# Patient Record
Sex: Female | Born: 1990 | Race: Black or African American | Hispanic: No | Marital: Married | Smoking: Former smoker
Health system: Southern US, Community
[De-identification: ages and names within clinical notes are randomized; demographics above are authoritative.]

## PROBLEM LIST (undated history)

## (undated) ENCOUNTER — Inpatient Hospital Stay (HOSPITAL_COMMUNITY): Payer: Self-pay

## (undated) HISTORY — PX: OTHER SURGICAL HISTORY: SHX169

---
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
## 2010-04-24 ENCOUNTER — Emergency Department (HOSPITAL_COMMUNITY)
Admission: EM | Admit: 2010-04-24 | Discharge: 2010-04-24 | Discharge status: Against Medical Advice | Payer: Self-pay | Attending: Emergency Medicine | Admitting: Emergency Medicine

## 2010-04-24 DIAGNOSIS — H9209 Otalgia, unspecified ear: Secondary | ICD-10-CM | POA: Insufficient documentation

## 2011-03-12 ENCOUNTER — Encounter (HOSPITAL_COMMUNITY): Payer: Self-pay | Admitting: Emergency Medicine

## 2011-03-12 ENCOUNTER — Other Ambulatory Visit: Payer: Self-pay

## 2011-03-12 ENCOUNTER — Emergency Department (HOSPITAL_COMMUNITY)
Admission: EM | Admit: 2011-03-12 | Discharge: 2011-03-12 | Disposition: A | Discharge status: Home / Self Care | Payer: BC Managed Care – PPO | Attending: Emergency Medicine | Admitting: Emergency Medicine

## 2011-03-12 ENCOUNTER — Emergency Department (HOSPITAL_COMMUNITY): Payer: BC Managed Care – PPO

## 2011-03-12 DIAGNOSIS — J45909 Unspecified asthma, uncomplicated: Secondary | ICD-10-CM | POA: Insufficient documentation

## 2011-03-12 DIAGNOSIS — R072 Precordial pain: Secondary | ICD-10-CM | POA: Insufficient documentation

## 2011-03-12 DIAGNOSIS — K219 Gastro-esophageal reflux disease without esophagitis: Secondary | ICD-10-CM | POA: Insufficient documentation

## 2011-03-12 MED ORDER — GI COCKTAIL ~~LOC~~
30.0000 mL | Freq: Once | ORAL | Status: AC
  Administered 2011-03-12: 30 mL via ORAL
  Filled 2011-03-12: qty 30

## 2011-03-12 MED ORDER — FAMOTIDINE 20 MG PO TABS: 20.0000 mg | ORAL_TABLET | Freq: Two times a day (BID) | ORAL | Status: DC

## 2011-03-12 NOTE — ED Provider Notes (Signed)
History     CSN: [id number]  Arrival date & time 03/12/11  7829   First MD Initiated Contact with Patient 03/12/11 [phone]      Chief Complaint  Patient presents with  . Chest Pain     HPI  History provided by the patient. She is a 21 year old female with past history of asthma who presents with complaints of midsternal chest pain that began yesterday afternoon. Pain is a burning sensation that does not radiate. Pain is made worse by lying flat. Pain is not pleuritic or made worse with exertion. Patient took aspirin for her symptoms without significant relief. Patient denies any alleviating factor. Patient denies any fever, chills, sweats, nausea, vomiting, heart palpitations, shortness of breath. Patient denies any recent cough, nasal congestion, rhinorrhea. Patient denies any tobacco use or drug use, no cocaine. Patient has no recent travel or surgery. Patient has no other significant past medical history.    Past Medical History  Diagnosis Date  . Asthma     Past Surgical History  Procedure Date  . Breast tumor     No family history on file.  History  Substance Use Topics  . Smoking status: Never Smoker   . Smokeless tobacco: Not on file  . Alcohol Use: No    OB History    Grav Para Term Preterm Abortions TAB SAB Ect Mult Living                  Review of Systems  Constitutional: Negative for fever and chills.  Respiratory: Negative for cough, shortness of breath and wheezing.   Cardiovascular: Positive for chest pain. Negative for palpitations and leg swelling.  Gastrointestinal: Negative for nausea, vomiting, abdominal pain, diarrhea, constipation and blood in stool.  All other systems reviewed and are negative.    Allergies  Review of patient's allergies indicates no known allergies.  Home Medications   Current Outpatient Rx  Name Route Sig Dispense Refill  . BORIC ACID EX Apply externally Apply 1 capsule topically 2 (two) times a week.    .  ERGOCALCIFEROL 50000 UNITS PO CAPS Oral Take 50,000 Units by mouth 2 (two) times a week.    Marland Kitchen PRESCRIPTION MEDICATION Oral Take 1 tablet by mouth daily.      LMP 03/08/2011  Physical Exam  Nursing note and vitals reviewed. Constitutional: She is oriented to person, place, and time. She appears well-developed and well-nourished. No distress.  HENT:  Head: Normocephalic.  Cardiovascular: Normal rate and regular rhythm.   Pulmonary/Chest: Effort normal and breath sounds normal.  Abdominal: Soft.  Neurological: She is alert and oriented to person, place, and time.  Skin: Skin is warm and dry. No rash noted.  Psychiatric: She has a normal mood and affect. Her behavior is normal.    ED Course  Procedures    Dg Chest 2 View  03/12/2011  *RADIOLOGY REPORT*  Clinical Data: Chest pain.  CHEST - 2 VIEW  Comparison: None.  Findings: The lungs are well-aerated and clear.  There is no evidence of focal opacification, pleural effusion or pneumothorax.  The heart is normal in size; the mediastinal contour is within normal limits.  No acute osseous abnormalities are seen.  IMPRESSION: No acute cardiopulmonary process seen.  Original Report Authenticated By: Tonia Ghent, M.D.     1. GERD (gastroesophageal reflux disease)       MDM  3:40 AM patient seen and evaluated. Patient in no acute distress. Pain is not pleuritic. Patient denies shortness of  breath. Patient has normal heart rate and O2 saturation. Patient does use oral birth control but has no other significant risk factors for DVT/PE.   Patient reports feeling better after GI cocktail. EKG and chest x-ray are unremarkable. Patient with no other concerning symptoms. Symptoms seem most consistent with possible reflux. At this time will recommend antacid treatments and follow up with PCP.    Date: 03/12/2011  Rate: 84  Rhythm: normal sinus rhythm  QRS Axis: normal  Intervals: normal  ST/T Wave abnormalities: normal  Conduction  Disutrbances:none  Narrative Interpretation:   Old EKG Reviewed: none available    Angus Seller, [place] 03/12/11 [phone]  Medical screening examination/treatment/procedure(s) were performed by non-physician practitioner and as supervising physician I was immediately available for consultation/collaboration.  Sunnie Nielsen, MD 03/12/11 [phone]

## 2011-03-12 NOTE — ED Notes (Signed)
PT. REPORTS MIDSTERNAL CHEST PAIN ONSET YESTERDAY , DENIES SOB ,NAUSEA OR DIAPHORESIS . NO COUGH OR CONGESTION .

## 2011-03-12 NOTE — ED Notes (Signed)
Patient transported to X-ray 

## 2011-03-12 NOTE — ED Notes (Signed)
Pt. Requested CKmb be ran, discussed with MD, pt. was told to have test run at PCP office, not indicated at this time.

## 2011-06-30 ENCOUNTER — Ambulatory Visit (INDEPENDENT_AMBULATORY_CARE_PROVIDER_SITE_OTHER): Payer: BC Managed Care – PPO | Admitting: Obstetrics and Gynecology

## 2011-06-30 ENCOUNTER — Encounter: Payer: Self-pay | Admitting: Obstetrics and Gynecology

## 2011-06-30 ENCOUNTER — Telehealth: Payer: Self-pay | Admitting: Obstetrics and Gynecology

## 2011-06-30 VITALS — BP 116/72 | HR 74 | Ht 67.25 in | Wt 291.0 lb

## 2011-06-30 DIAGNOSIS — N898 Other specified noninflammatory disorders of vagina: Secondary | ICD-10-CM

## 2011-06-30 DIAGNOSIS — Z113 Encounter for screening for infections with a predominantly sexual mode of transmission: Secondary | ICD-10-CM

## 2011-06-30 LAB — POCT WET PREP (WET MOUNT)

## 2011-06-30 MED ORDER — METRONIDAZOLE 500 MG PO TABS: 500.0000 mg | ORAL_TABLET | Freq: Two times a day (BID) | ORAL | Status: AC

## 2011-06-30 MED ORDER — AMBULATORY NON FORMULARY MEDICATION: 50000.0000 [IU] | Status: DC

## 2011-06-30 MED ORDER — FLUCONAZOLE 150 MG PO TABS: 150.0000 mg | ORAL_TABLET | Freq: Once | ORAL | Status: AC

## 2011-06-30 MED ORDER — NORETHIN ACE-ETH ESTRAD-FE 1-20 MG-MCG PO TABS: 1.0000 | ORAL_TABLET | Freq: Every day | ORAL | Status: DC

## 2011-06-30 NOTE — Progress Notes (Signed)
Color: white Odor: yes Itching:yes Thin:yes Thick:yes Fever:no Dyspareunia:no Hx PID:no HX STD:no Pelvic Pain:no Desires Gc/CT:yes Desires HIV,RPR,HbsAG:yes  Pt c/o ph level seem off

## 2011-06-30 NOTE — Progress Notes (Signed)
21 YO c/o recurrent vaginitis symptoms and desires refill on contraception.  Patient used Boric Acid Capsules x 2 months but states that they did not work.  Also took only 4 weeks of Vitamin D 50,000 units because that was all she was given at pharmacy  (Vitamin D was 10 on Jan. 11, 2013).   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

## 2011-06-30 NOTE — Patient Instructions (Signed)
Avoid: - excess soap on genital area (consider using plain oatmeal soap) - use of powder or sprays in genital area - douching - wearing underwear to bed (except with menses) - using more than is directed detergent when washing clothes - tight fitting garments around genital area - excess sugar intake   

## 2011-07-01 LAB — GC/CHLAMYDIA PROBE AMP, GENITAL: GC Probe Amp, Genital: NEGATIVE

## 2011-07-01 LAB — HSV 2 ANTIBODY, IGG: HSV 2 Glycoprotein G Ab, IgG: 0.17 IV

## 2011-07-01 LAB — RPR

## 2011-07-01 LAB — HEPATITIS C ANTIBODY: HCV Ab: NEGATIVE

## 2011-07-14 NOTE — Telephone Encounter (Signed)
Lm on vm tcb rgd msg 

## 2011-07-14 NOTE — Telephone Encounter (Signed)
Triage/epic 

## 2011-07-16 NOTE — Telephone Encounter (Signed)
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

## 2012-02-19 ENCOUNTER — Emergency Department (HOSPITAL_COMMUNITY)
Admission: EM | Admit: 2012-02-19 | Discharge: 2012-02-19 | Discharge status: Against Medical Advice | Payer: BC Managed Care – PPO | Attending: Emergency Medicine | Admitting: Emergency Medicine

## 2012-02-19 DIAGNOSIS — J45909 Unspecified asthma, uncomplicated: Secondary | ICD-10-CM | POA: Insufficient documentation

## 2012-02-19 NOTE — ED Notes (Signed)
Heart rate 122, oxygen 100 nurse Maggie RN aware

## 2012-02-19 NOTE — ED Notes (Signed)
[employer] reports patient left the building

## 2012-06-09 ENCOUNTER — Emergency Department (HOSPITAL_COMMUNITY)
Admission: EM | Admit: 2012-06-09 | Discharge: 2012-06-09 | Disposition: A | Discharge status: Home / Self Care | Payer: BC Managed Care – PPO | Attending: Emergency Medicine | Admitting: Emergency Medicine

## 2012-06-09 ENCOUNTER — Encounter (HOSPITAL_COMMUNITY): Payer: Self-pay | Admitting: *Deleted

## 2012-06-09 ENCOUNTER — Emergency Department (HOSPITAL_COMMUNITY): Payer: BC Managed Care – PPO

## 2012-06-09 DIAGNOSIS — J45909 Unspecified asthma, uncomplicated: Secondary | ICD-10-CM | POA: Insufficient documentation

## 2012-06-09 DIAGNOSIS — I498 Other specified cardiac arrhythmias: Secondary | ICD-10-CM | POA: Insufficient documentation

## 2012-06-09 DIAGNOSIS — R0789 Other chest pain: Secondary | ICD-10-CM

## 2012-06-09 DIAGNOSIS — R000 Tachycardia, unspecified: Secondary | ICD-10-CM

## 2012-06-09 DIAGNOSIS — F411 Generalized anxiety disorder: Secondary | ICD-10-CM | POA: Insufficient documentation

## 2012-06-09 DIAGNOSIS — K219 Gastro-esophageal reflux disease without esophagitis: Secondary | ICD-10-CM | POA: Insufficient documentation

## 2012-06-09 DIAGNOSIS — IMO0002 Reserved for concepts with insufficient information to code with codable children: Secondary | ICD-10-CM | POA: Insufficient documentation

## 2012-06-09 DIAGNOSIS — R509 Fever, unspecified: Secondary | ICD-10-CM

## 2012-06-09 DIAGNOSIS — Z79899 Other long term (current) drug therapy: Secondary | ICD-10-CM | POA: Insufficient documentation

## 2012-06-09 DIAGNOSIS — K089 Disorder of teeth and supporting structures, unspecified: Secondary | ICD-10-CM | POA: Insufficient documentation

## 2012-06-09 LAB — CBC
HCT: 40.7 % (ref 36.0–46.0)
Hemoglobin: 14 g/dL (ref 12.0–15.0)
MCH: 27.2 pg (ref 26.0–34.0)
MCHC: 34.4 g/dL (ref 30.0–36.0)
MCV: 79 fL (ref 78.0–100.0)
Platelets: 145 K/uL — ABNORMAL LOW (ref 150–400)
RBC: 5.15 MIL/uL — ABNORMAL HIGH (ref 3.87–5.11)
RDW: 15.3 % (ref 11.5–15.5)
WBC: 5.3 10*3/uL (ref 4.0–10.5)

## 2012-06-09 LAB — URINE MICROSCOPIC-ADD ON

## 2012-06-09 LAB — POCT I-STAT, CHEM 8
BUN: 9 mg/dL (ref 6–23)
Calcium, Ion: 1.22 mmol/L (ref 1.12–1.23)
Chloride: 107 meq/L (ref 96–112)
Creatinine, Ser: 0.8 mg/dL (ref 0.50–1.10)
Glucose, Bld: 101 mg/dL — ABNORMAL HIGH (ref 70–99)
HCT: 45 % (ref 36.0–46.0)
Hemoglobin: 15.3 g/dL — ABNORMAL HIGH (ref 12.0–15.0)
Potassium: 3.9 mEq/L (ref 3.5–5.1)
Sodium: 143 meq/L (ref 135–145)
TCO2: 26 mmol/L (ref 0–100)

## 2012-06-09 LAB — URINALYSIS, ROUTINE W REFLEX MICROSCOPIC
Bilirubin Urine: NEGATIVE
Glucose, UA: NEGATIVE mg/dL
Hgb urine dipstick: NEGATIVE
Ketones, ur: 15 mg/dL — AB
Nitrite: NEGATIVE
Protein, ur: NEGATIVE mg/dL
Specific Gravity, Urine: 1.025 (ref 1.005–1.030)
Urobilinogen, UA: 0.2 mg/dL (ref 0.0–1.0)
pH: 6 (ref 5.0–8.0)

## 2012-06-09 LAB — D-DIMER, QUANTITATIVE: D-Dimer, Quant: 1.69 ug{FEU}/mL — ABNORMAL HIGH (ref 0.00–0.48)

## 2012-06-09 LAB — POCT I-STAT TROPONIN I: Troponin i, poc: 0.01 ng/mL (ref 0.00–0.08)

## 2012-06-09 MED ORDER — IBUPROFEN 600 MG PO TABS: 600.0000 mg | ORAL_TABLET | Freq: Four times a day (QID) | ORAL | Status: DC | PRN

## 2012-06-09 MED ORDER — KETOROLAC TROMETHAMINE 30 MG/ML IJ SOLN
30.0000 mg | Freq: Once | INTRAMUSCULAR | Status: AC
  Administered 2012-06-09: 30 mg via INTRAVENOUS
  Filled 2012-06-09: qty 1

## 2012-06-09 MED ORDER — IOHEXOL 350 MG/ML SOLN
100.0000 mL | Freq: Once | INTRAVENOUS | Status: AC | PRN
  Administered 2012-06-09: 120 mL via INTRAVENOUS

## 2012-06-09 MED ORDER — SODIUM CHLORIDE 0.9 % IV BOLUS (SEPSIS)
1000.0000 mL | Freq: Once | INTRAVENOUS | Status: AC
  Administered 2012-06-09: 1000 mL via INTRAVENOUS

## 2012-06-09 MED ORDER — SODIUM CHLORIDE 0.9 % IV SOLN: Freq: Once | INTRAVENOUS | Status: DC

## 2012-06-09 MED ORDER — LORAZEPAM 2 MG/ML IJ SOLN
1.0000 mg | Freq: Once | INTRAMUSCULAR | Status: AC
  Administered 2012-06-09: 1 mg via INTRAVENOUS
  Filled 2012-06-09: qty 1

## 2012-06-09 MED ORDER — MORPHINE SULFATE 4 MG/ML IJ SOLN
4.0000 mg | Freq: Once | INTRAMUSCULAR | Status: AC
  Administered 2012-06-09: 4 mg via INTRAVENOUS
  Filled 2012-06-09: qty 1

## 2012-06-09 NOTE — ED Notes (Signed)
Pt to ED c/o acute onset R sided chest pain that radiated to R arm yesterday.  Denies sob, diaphoresis.  Pt tearful b/c she would like to know what is going.

## 2012-06-09 NOTE — ED Notes (Signed)
Pt is crying and reports increased stress at her job, reports death of friend last week of MI.

## 2012-06-09 NOTE — ED Provider Notes (Signed)
Patient signed out to me with CT angiography pending. Patient was seen for chest discomfort with a low-grade fever. Cardiac workup is negative. Patient did have some tachycardia associated with the fever and the pain. This improved with treatment. CT angiography was ordered to rule out PE and none is seen. Patient reexamined and feeling better after treatment. We'll discharged home, treat fever with Motrin and Tylenol.  Gilda Crease, MD 06/09/12 1729

## 2012-06-09 NOTE — Discharge Instructions (Signed)
Chest Pain (Nonspecific) It is often hard to give a specific diagnosis for the cause of chest pain. There is always a chance that your pain could be related to something serious, such as a heart attack or a blood clot in the lungs. You need to follow up with your caregiver for further evaluation. CAUSES   Heartburn.  Pneumonia or bronchitis.  Anxiety or stress.  Inflammation around your heart (pericarditis) or lung (pleuritis or pleurisy).  A blood clot in the lung.  A collapsed lung (pneumothorax). It can develop suddenly on its own (spontaneous pneumothorax) or from injury (trauma) to the chest.  Shingles infection (herpes zoster virus). The chest wall is composed of bones, muscles, and cartilage. Any of these can be the source of the pain.  The bones can be bruised by injury.  The muscles or cartilage can be strained by coughing or overwork.  The cartilage can be affected by inflammation and become sore (costochondritis). DIAGNOSIS  Lab tests or other studies, such as X-rays, electrocardiography, stress testing, or cardiac imaging, may be needed to find the cause of your pain.  TREATMENT   Treatment depends on what may be causing your chest pain. Treatment may include:  Acid blockers for heartburn.  Anti-inflammatory medicine.  Pain medicine for inflammatory conditions.  Antibiotics if an infection is present.  You may be advised to change lifestyle habits. This includes stopping smoking and avoiding alcohol, caffeine, and chocolate.  You may be advised to keep your head raised (elevated) when sleeping. This reduces the chance of acid going backward from your stomach into your esophagus.  Most of the time, nonspecific chest pain will improve within 2 to 3 days with rest and mild pain medicine. HOME CARE INSTRUCTIONS   If antibiotics were prescribed, take your antibiotics as directed. Finish them even if you start to feel better.  For the next few days, avoid physical  activities that bring on chest pain. Continue physical activities as directed.  Do not smoke.  Avoid drinking alcohol.  Only take over-the-counter or prescription medicine for pain, discomfort, or fever as directed by your caregiver.  Follow your caregiver's suggestions for further testing if your chest pain does not go away.  Keep any follow-up appointments you made. If you do not go to an appointment, you could develop lasting (chronic) problems with pain. If there is any problem keeping an appointment, you must call to reschedule. SEEK MEDICAL CARE IF:   You think you are having problems from the medicine you are taking. Read your medicine instructions carefully.  Your chest pain does not go away, even after treatment.  You develop a rash with blisters on your chest. SEEK IMMEDIATE MEDICAL CARE IF:   You have increased chest pain or pain that spreads to your arm, neck, jaw, back, or abdomen.  You develop shortness of breath, an increasing cough, or you are coughing up blood.  You have severe back or abdominal pain, feel nauseous, or vomit.  You develop severe weakness, fainting, or chills.  You have a fever. THIS IS AN EMERGENCY. Do not wait to see if the pain will go away. Get medical help at once. Call your local emergency services (911 in [place] Do not drive yourself to the hospital. MAKE SURE YOU:   Understand these instructions.  Will watch your condition.  Will get help right away if you are not doing well or get worse. Document Released: 11/11/2004 Document Revised: 04/26/2011 Document Reviewed: 09/07/2007 [facility] Patient Information 2013 [employer].  Fever, Adult A fever is a temperature of 100.4 F (38 C) or above.  HOME CARE  Take fever medicine as told by your doctor. Do not  take aspirin for fever if you are younger than 23 years of age.  If you are given antibiotic medicine, take it as told. Finish the medicine even if you start to feel  better.  Rest.  Drink enough fluids to keep your pee (urine) clear or pale yellow. Do not drink alcohol.  Take a bath or shower with room temperature water. Do not use ice water or alcohol sponge baths.  Wear lightweight, loose clothes. GET HELP RIGHT AWAY IF:   You are short of breath or have trouble breathing.  You are very weak.  You are dizzy or you pass out (faint).  You are very thirsty or are making [place] or no urine.  You have new pain.  You throw up (vomit) or have watery poop (diarrhea).  You keep throwing up or having watery poop for more than 1 to 2 days.  You have a stiff neck or light bothers your eyes.  You have a skin rash.  You have a fever or problems (symptoms) that last for more than 2 to 3 days.  You have a fever and your problems quickly get worse.  You keep throwing up the fluids you drink.  You do not feel better after 3 days.  You have new problems. MAKE SURE YOU:   Understand these instructions.  Will watch your condition.  Will get help right away if you are not doing well or get worse. Document Released: 11/11/2007 Document Revised: 04/26/2011 Document Reviewed: 12/03/2010 [facility] Patient Information 2013 [place], [place].

## 2012-06-09 NOTE — ED Provider Notes (Addendum)
History     CSN: [id number]  Arrival date & time 06/09/12  1228   First MD Initiated Contact with Patient 06/09/12 1301      Chief Complaint  Patient presents with  . Chest Pain    (Consider location/radiation/quality/duration/timing/severity/associated sxs/prior treatment) HPI Comments: ppatient reports while at work this morning she had a sudden discomfort in the right anterior chest wall region that quickly radiated to the right shoulder and into the right upper extremity. Pain has subsided slightly but is still in roughly the same location, not worse with deep breath or movement. She reports that she's had similar symptoms in the past, was treated for esophageal reflux. She denies any significant shortness of breath. The patient has had some dental pain in the right upper area, was given amoxicillin and hydrocodone by her primary care physician. She was supposed to have called her dentist today and she is undergoing the process of changing records from her prior dentist to her current dentist. She reports she stopped taking both amoxicillin and hydrocodone a few days ago do to nausea and vomiting episodes when taking them. She is unsure which medicine in particular is causing her symptoms therefore stopped both. She reports she has had some chills and fevers today. She denies significant cough, nasal congestion, sore throat. She denies any vomiting or diarrhea. She denies any significant lower extremity edema, pain behind her knees. She denies any longer since travel. She does use birth control pills, does not smoke cigarettes. She is concerned because she had a friend die of a heart attack a couple weeks ago. She denies a significant family history of cardiac disease however.  Patient is a 22 y.o. female presenting with chest pain. The history is provided by the patient.  Chest Pain Associated symptoms: no abdominal pain, no back pain, no cough, no dizziness, no headache, no nausea, no  palpitations, no shortness of breath and not vomiting     Past Medical History  Diagnosis Date  . Asthma     Past Surgical History  Procedure Laterality Date  . Breast tumor      Family History  Problem Relation Age of Onset  . Heart disease Maternal Grandmother   . Thyroid disease Maternal Grandmother   . Cancer Maternal Grandmother     breast and ovarian  . Heart disease Maternal Grandfather   . Stroke Maternal Grandfather   . Eclampsia Father   . Seizures Father   . Thyroid disease Mother   . Migraines Sister     History  Substance Use Topics  . Smoking status: Never Smoker   . Smokeless tobacco: Never Used  . Alcohol Use: Yes     Comment: sometime    OB History   Grav Para Term Preterm Abortions TAB SAB Ect Mult Living   0         0      Review of Systems  Constitutional: Positive for chills.  HENT: Positive for dental problem. Negative for congestion.   Respiratory: Negative for cough and shortness of breath.   Cardiovascular: Positive for chest pain. Negative for palpitations and leg swelling.  Gastrointestinal: Negative for nausea, vomiting, abdominal pain and diarrhea.  Musculoskeletal: Negative for back pain and joint swelling.  Skin: Negative for rash.  Neurological: Negative for dizziness, syncope, speech difficulty and headaches.  All other systems reviewed and are negative.    Allergies  Review of patient's allergies indicates no known allergies.  Home Medications   Current Outpatient  Rx  Name  Route  Sig  Dispense  Refill  . Fluticasone-Salmeterol (ADVAIR) 250-50 MCG/DOSE AEPB   Inhalation   Inhale 1 puff into the lungs every 12 (twelve) hours.         . norethindrone-ethinyl estradiol (JUNEL FE,GILDESS FE,LOESTRIN FE) 1-20 MG-MCG tablet   Oral   Take 1 tablet by mouth daily.   1 Package   11     BP 119/73  Pulse 114  Temp(Src) 100.8 F (38.2 C) (Oral)  Resp 20  Ht 5\' 8"  (1.727 m)  Wt 310 lb (140.615 kg)  BMI 47.15 kg/m2   SpO2 99%  LMP 05/25/2012  Physical Exam  Nursing note and vitals reviewed. Constitutional: She appears well-developed and well-nourished.  HENT:  Head: Normocephalic and atraumatic.  Eyes: Conjunctivae and EOM are normal.  Neck: Normal range of motion. Neck supple.  Cardiovascular: Regular rhythm and intact distal pulses.  Tachycardia present.   No murmur heard. Pulmonary/Chest: Effort normal. No accessory muscle usage. No respiratory distress. She has no decreased breath sounds. She has no wheezes. She has no rales. Chest wall is not dull to percussion. She exhibits tenderness. She exhibits no mass, no bony tenderness and no crepitus.  Abdominal: Soft. She exhibits no distension. There is no tenderness. There is no rebound.  Musculoskeletal: She exhibits no edema and no tenderness.  Neurological: She is alert. She exhibits normal muscle tone. Coordination normal.  Skin: Skin is warm. No rash noted.  Psychiatric: Her mood appears anxious.    ED Course  Procedures (including critical care time)  Labs Reviewed  CBC - Abnormal; Notable for the following:    RBC 5.15 (*)    Platelets 145 (*)    All other components within normal limits  D-DIMER, QUANTITATIVE - Abnormal; Notable for the following:    D-Dimer, Quant 1.69 (*)    All other components within normal limits  POCT I-STAT, CHEM 8 - Abnormal; Notable for the following:    Glucose, Bld 101 (*)    Hemoglobin 15.3 (*)    All other components within normal limits  URINALYSIS, ROUTINE W REFLEX MICROSCOPIC  POCT I-STAT TROPONIN I   Dg Chest 2 View  06/09/2012  *RADIOLOGY REPORT*  Clinical Data: Chest pain.  CHEST - 2 VIEW  Comparison: No priors.  Findings: Lung volumes are normal.  No consolidative airspace disease.  No pleural effusions.  No pneumothorax.  No pulmonary nodule or mass noted.  Pulmonary vasculature and the cardiomediastinal silhouette are within normal limits.   Lucency projecting over the lateral aspect of the  right hemithorax as compatible with a skin fold.  IMPRESSION: 1. No radiographic evidence of acute cardiopulmonary disease.   Original Report Authenticated By: Trudie Reed, M.D.      1. Sinus tachycardia   2. Atypical chest pain   3. Fever     Room air saturation is 100% I interpret this to be normal.  EKG at time 12:34, shows sinus tachycardia at a rate of 140. Axis is normal. Poor R-wave progression noted from leads V2 through V4. Compared to EKG from 03/12/2011, rate is faster. Poor R wave progression was seen previously. Criteria for left ventricular hypertrophy is no longer seen. No new overt ischemic changes.   3:49 PM Pt still feels about the same, sinus tachycardic did improve from 140's to 110's here in the ED.  DDimer is elevated, will proceed with CT angio of chest.  Ddimer may be elevated due to fever and possible viral  syndrome.  Will continue IVF's, give different IV analgesics and continue to monitor.  Will sign out to Dr. Blinda Leatherwood.  Abd remains soft, non tender.  UA is still pending  MDM   Patient with sinus tachycardia here, chest pain that is somewhat reproducible on exam here. Patient is only 22 years old, is overweight. Patient also with low grade fever at 100.61F. Plan is to get a chest x-ray, check urinalysis, blood test for source of fever. I suspect this pain is multifactorial given fever, tachycardia and recent significant stressors including death of a close friend. Also do to recent heart attack and her friend, patient admits that she has been worried about this chest pain that began today. We'll give her IV fluids, IV Toradol for pain and for the fever, will try some IV Ativan for relaxation will reassess the patient and recheck her vital signs.        Gavin Pound. Oletta Lamas, MD 06/09/12 1551  Gavin Pound. Jaiana Sheffer, MD 06/09/12 1553

## 2013-03-18 ENCOUNTER — Ambulatory Visit (INDEPENDENT_AMBULATORY_CARE_PROVIDER_SITE_OTHER): Payer: Federal, State, Local not specified - PPO | Admitting: Family Medicine

## 2013-03-18 VITALS — BP 122/80 | HR 111 | Temp 99.4°F | Resp 18 | Ht 68.0 in | Wt 324.0 lb

## 2013-03-18 DIAGNOSIS — R220 Localized swelling, mass and lump, head: Secondary | ICD-10-CM

## 2013-03-18 DIAGNOSIS — R600 Localized edema: Secondary | ICD-10-CM

## 2013-03-18 DIAGNOSIS — R6884 Jaw pain: Secondary | ICD-10-CM

## 2013-03-18 DIAGNOSIS — K111 Hypertrophy of salivary gland: Secondary | ICD-10-CM

## 2013-03-18 DIAGNOSIS — R221 Localized swelling, mass and lump, neck: Secondary | ICD-10-CM

## 2013-03-18 DIAGNOSIS — R609 Edema, unspecified: Secondary | ICD-10-CM

## 2013-03-18 LAB — POCT CBC
GRANULOCYTE PERCENT: 41.1 % (ref 37–80)
HCT, POC: 42.5 % (ref 37.7–47.9)
Hemoglobin: 13.5 g/dL (ref 12.2–16.2)
Lymph, poc: 4 — AB (ref 0.6–3.4)
MCH, POC: 27.4 pg (ref 27–31.2)
MCHC: 31.8 g/dL (ref 31.8–35.4)
MCV: 86.4 fL (ref 80–97)
MID (CBC): 0.5 (ref 0–0.9)
MPV: 8.7 fL (ref 0–99.8)
PLATELET COUNT, POC: 245 10*3/uL (ref 142–424)
POC GRANULOCYTE: 3.1 (ref 2–6.9)
POC LYMPH PERCENT: 52.1 %L — AB (ref 10–50)
POC MID %: 6.8 % (ref 0–12)
RBC: 4.92 M/uL (ref 4.04–5.48)
RDW, POC: 14.9 %
WBC: 7.6 10*3/uL (ref 4.6–10.2)

## 2013-03-18 LAB — GLUCOSE, POCT (MANUAL RESULT ENTRY): POC GLUCOSE: 87 mg/dL (ref 70–99)

## 2013-03-18 LAB — POCT INFLUENZA A/B
INFLUENZA B, POC: NEGATIVE
Influenza A, POC: NEGATIVE

## 2013-03-18 MED ORDER — AMOXICILLIN-POT CLAVULANATE 875-125 MG PO TABS: 1.0000 | ORAL_TABLET | Freq: Two times a day (BID) | ORAL | Status: DC

## 2013-03-18 MED ORDER — IBUPROFEN 800 MG PO TABS: 800.0000 mg | ORAL_TABLET | Freq: Three times a day (TID) | ORAL | Status: DC | PRN

## 2013-03-18 NOTE — Patient Instructions (Signed)
Parotitis °Parotitis is soreness and swelling (inflammation) of one or both parotid glands. The parotid glands produce saliva. They are located on each side of the face, below and in front of the earlobes. The saliva produced comes out of tiny openings (ducts) inside the cheeks. In most cases, parotitis goes away over time or with treatment. If your parotitis is caused by certain long-term (chronic) diseases, it may come back again.  °CAUSES  °Parotitis can be caused by: °· Viral infections. Mumps is one viral infection that can cause parotitis. °· Bacterial infections. °· Blockage of the salivary ducts due to a salivary stone. °· Narrowing of the salivary ducts. °· Swelling of the salivary ducts. °· Dehydration. °· Autoimmune conditions, such as sarcoidosis or Sjogren's syndrome. °· Air from activities such as scuba diving, glass blowing, or playing an instrument (rare). °· Human immunodeficiency virus (HIV) or acquired immunodeficiency syndrome (AIDS). °· Tuberculosis. °SYMPTOMS  °· The ears may appear to be pushed up and out from their normal position. °· Redness (erythema) of the skin over the parotid glands. °· Pain and tenderness over the parotid glands. °· Swelling in the parotid gland area. °· Yellowish-white fluid (pus) coming from the ducts inside the cheeks. °· Dry mouth. °· Bad taste in the mouth. °DIAGNOSIS  °Your caregiver may determine that you have parotitis based on your symptoms and a physical exam. A sample of fluid may also be taken from the parotid gland and tested to find the cause of your infection. X-rays or computed tomography (CT) scans may be taken if your caregiver thinks you might have a salivary stone blocking your salivary duct. °TREATMENT  °Treatment varies depending upon the cause of your parotitis. If your parotitis is caused by mumps, no treatment is needed. The condition will go away on its own after 7 to 10 days. In other cases, treatment may include: °· Antibiotics if your  infection was caused by bacteria. °· Pain medicines. °· Gland massage. °· Eating sour candy to increase your saliva production. °· Removal of salivary stones. Your caregiver may flush stones out with fluids or remove them with tweezers. °· Surgery to remove the parotid glands. °HOME CARE INSTRUCTIONS  °· If you were given antibiotics, take them as directed. Finish them even if you start to feel better. °· Put warm compresses on the sore area. °· Only take over-the-counter or prescription medicines for pain, discomfort, or fever as directed by your caregiver. °· Drink enough fluids to keep your urine clear or pale yellow. °SEEK IMMEDIATE MEDICAL CARE IF:  °· You have increasing pain or swelling that is not controlled with medicine. °· You have a fever. °MAKE SURE YOU: °· Understand these instructions. °· Will watch your condition. °· Will get help right away if you are not doing well or get worse. °Document Released: 07/24/2001 Document Revised: 04/26/2011 Document Reviewed: 12/28/2010 °[employer]® Patient Information ©2014 [employer]. ° °

## 2013-03-18 NOTE — Progress Notes (Signed)
Subjective:    Patient ID: Stacey May, female    DOB: 10/22/1990, 23 y.o.   MRN: [id number]  HPI This 23 y.o. female presents for evaluation of one week history of L jaw pain.  Onset one week ago.   No fever; no chills/sweats.  +HA.  No ear pain.  No sore throat.  No body aches; no malaise.  +swelling along L face onset yesterday.  No teeth pain; no pain with brushing teeth except to open mouth to brush teeth.  +pain with chewing due to jaw pain.  Wisdom teeth are coming in but that is an ongoing issue.   No worsening swelling with eating but has not been eating much today; appetite is down.  Taking Ibuprofen 400mg  and Tylenol.  No similar symptoms in past.  [place] [employer].  Did travel to [place] recently; traveled all over [place].  Felt like tongue was swollen yesterday but is not today. Mother is a Development worker, communityphysician [place].  Denies cough, nasal congestion, rhinorrhea.  No rash.  PCP: Parke SimmersBland  Review of Systems  Constitutional: Negative for fever, chills, diaphoresis and fatigue.  HENT: Positive for facial swelling. Negative for congestion, ear pain, hearing loss, mouth sores, postnasal drip, rhinorrhea, sinus pressure, sneezing, sore throat, trouble swallowing and voice change.   Eyes: Negative for redness and visual disturbance.  Respiratory: Negative for cough and shortness of breath.   Gastrointestinal: Negative for nausea, vomiting and diarrhea.  Skin: Negative for rash.  Neurological: Positive for headaches. Negative for weakness and numbness.   Past Medical History  Diagnosis Date  . Asthma    Past Surgical History  Procedure Laterality Date  . Breast tumor     No Known Allergies Current Outpatient Prescriptions on File Prior to Visit  Medication Sig Dispense Refill  . Fluticasone-Salmeterol (ADVAIR) 250-50 MCG/DOSE AEPB Inhale 1 puff into the lungs every 12 (twelve) hours.      . norethindrone-ethinyl estradiol (JUNEL FE,GILDESS FE,LOESTRIN FE)  1-20 MG-MCG tablet Take 1 tablet by mouth daily.  1 Package  11  . ibuprofen (ADVIL,MOTRIN) 600 MG tablet Take 1 tablet (600 mg total) by mouth every 6 (six) hours as needed for pain or fever.  30 tablet  0   No current facility-administered medications on file prior to visit.   History  Substance Use Topics  . Smoking status: Never Smoker   . Smokeless tobacco: Never Used  . Alcohol Use: Yes     Comment: sometime       Objective:   Physical Exam  Nursing note and vitals reviewed. Constitutional: She is oriented to person, place, and time. She appears well-developed and well-nourished. No distress.  Obese.  HENT:  Head: Normocephalic and atraumatic.  Right Ear: External ear normal.  Left Ear: External ear normal.  Nose: Nose normal.  Mouth/Throat: Uvula is midline, oropharynx is clear and moist and mucous membranes are normal. No oral lesions. Normal dentition. No dental abscesses, uvula swelling or dental caries. No oropharyngeal exudate, posterior oropharyngeal edema, posterior oropharyngeal erythema or tonsillar abscesses.  L facial swelling mild to moderate.  +TTP pre-auricular region at TMJ.  Gumlines upper and lower without swelling, tenderness, fluctuants.  Eyes: Conjunctivae are normal. Pupils are equal, round, and reactive to light.  Neck: Normal range of motion. Neck supple. No thyromegaly present.  Cardiovascular: Normal rate, regular rhythm and normal heart sounds.   No murmur heard. Pulmonary/Chest: Effort normal and breath sounds normal.  Lymphadenopathy:    She  has no cervical adenopathy.  Neurological: She is alert and oriented to person, place, and time.  Skin: She is not diaphoretic.  Psychiatric: She has a normal mood and affect. Her behavior is normal.    Results for orders placed in visit on 03/18/13  POCT INFLUENZA A/B      Result Value Range   Influenza A, POC Negative     Influenza B, POC Negative    POCT CBC      Result Value Range   WBC 7.6  4.6  - 10.2 K/uL   Lymph, poc 4.0 (*) 0.6 - 3.4   POC LYMPH PERCENT 52.1 (*) 10 - 50 %L   MID (cbc) 0.5  0 - 0.9   POC MID % 6.8  0 - 12 %M   POC Granulocyte 3.1  2 - 6.9   Granulocyte percent 41.1  37 - 80 %G   RBC 4.92  4.04 - 5.48 M/uL   Hemoglobin 13.5  12.2 - 16.2 g/dL   HCT, POC 16.1  09.6 - 47.9 %   MCV 86.4  80 - 97 fL   MCH, POC 27.4  27 - 31.2 pg   MCHC 31.8  31.8 - 35.4 g/dL   RDW, POC 04.5     Platelet Count, POC 245  142 - 424 K/uL   MPV 8.7  0 - 99.8 fL  GLUCOSE, POCT (MANUAL RESULT ENTRY)      Result Value Range   POC Glucose 87  70 - 99 mg/dl       Assessment & Plan:  Salivary gland swelling - Plan: POCT Influenza A/B, POCT CBC, POCT glucose (manual entry), Epstein-Barr virus VCA antibody panel, Mumps antibody, IgM, CANCELED: Measles/Mumps/Rubella Immunity  Parotid swelling - Plan: POCT CBC, POCT glucose (manual entry), Epstein-Barr virus VCA antibody panel, Mumps antibody, IgM, CANCELED: Measles/Mumps/Rubella Immunity  Jaw pain  1.  L parotid swelling: New.  Obtain labs including EBV, Mumps titers.  Treat empirically with Augmentin, Ibuprofen 800mg  tid.  Refer to ENT; advised to undergo ENT evaluation if no improvement in 72 hours.  RTC for acute worsening. 2.  Jaw pain L:  New.  Secondary to parotid gland swelling.  Rx for Ibuprofen 800mg  tid.  Meds ordered this encounter  Medications  . amoxicillin-clavulanate (AUGMENTIN) 875-125 MG per tablet    Sig: Take 1 tablet by mouth 2 (two) times daily.    Dispense:  20 tablet    Refill:  0  . ibuprofen (ADVIL,MOTRIN) 800 MG tablet    Sig: Take 1 tablet (800 mg total) by mouth every 8 (eight) hours as needed.    Dispense:  30 tablet    Refill:  0   Nilda Simmer, M.D.  [facility] [address] [place], [place]  [address] [phone] phone [phone] fax

## 2013-03-20 LAB — EPSTEIN-BARR VIRUS VCA ANTIBODY PANEL
EBV EA IgG: <5 U/mL (ref <9.0)
EBV NA IgG: 43.7 U/mL — ABNORMAL HIGH (ref <18.0)
EBV VCA IGG: 159 U/mL — AB (ref <18.0)

## 2013-03-22 LAB — MUMPS ANTIBODY, IGM: Mumps IgM Value: <1:20 {titer}

## 2013-03-25 ENCOUNTER — Encounter: Payer: Self-pay | Admitting: Family Medicine

## 2013-04-02 ENCOUNTER — Telehealth: Payer: Self-pay

## 2013-04-02 NOTE — Telephone Encounter (Signed)
Patient wants someone to call her with her labs.  [phone]

## 2013-04-03 ENCOUNTER — Other Ambulatory Visit: Payer: Self-pay | Admitting: *Deleted

## 2013-04-03 NOTE — Telephone Encounter (Signed)
Pt was made aware of her lab results.

## 2013-04-03 NOTE — Telephone Encounter (Signed)
Unable to leave message. Try again later.

## 2013-09-13 IMAGING — CR DG CHEST 2V
3 series · 3 of 3 positions shown · non-contrast
Comparison: No priors.

CLINICAL DATA: Chest pain.

CHEST - 2 VIEW

[w chest pa (1 of 2)]
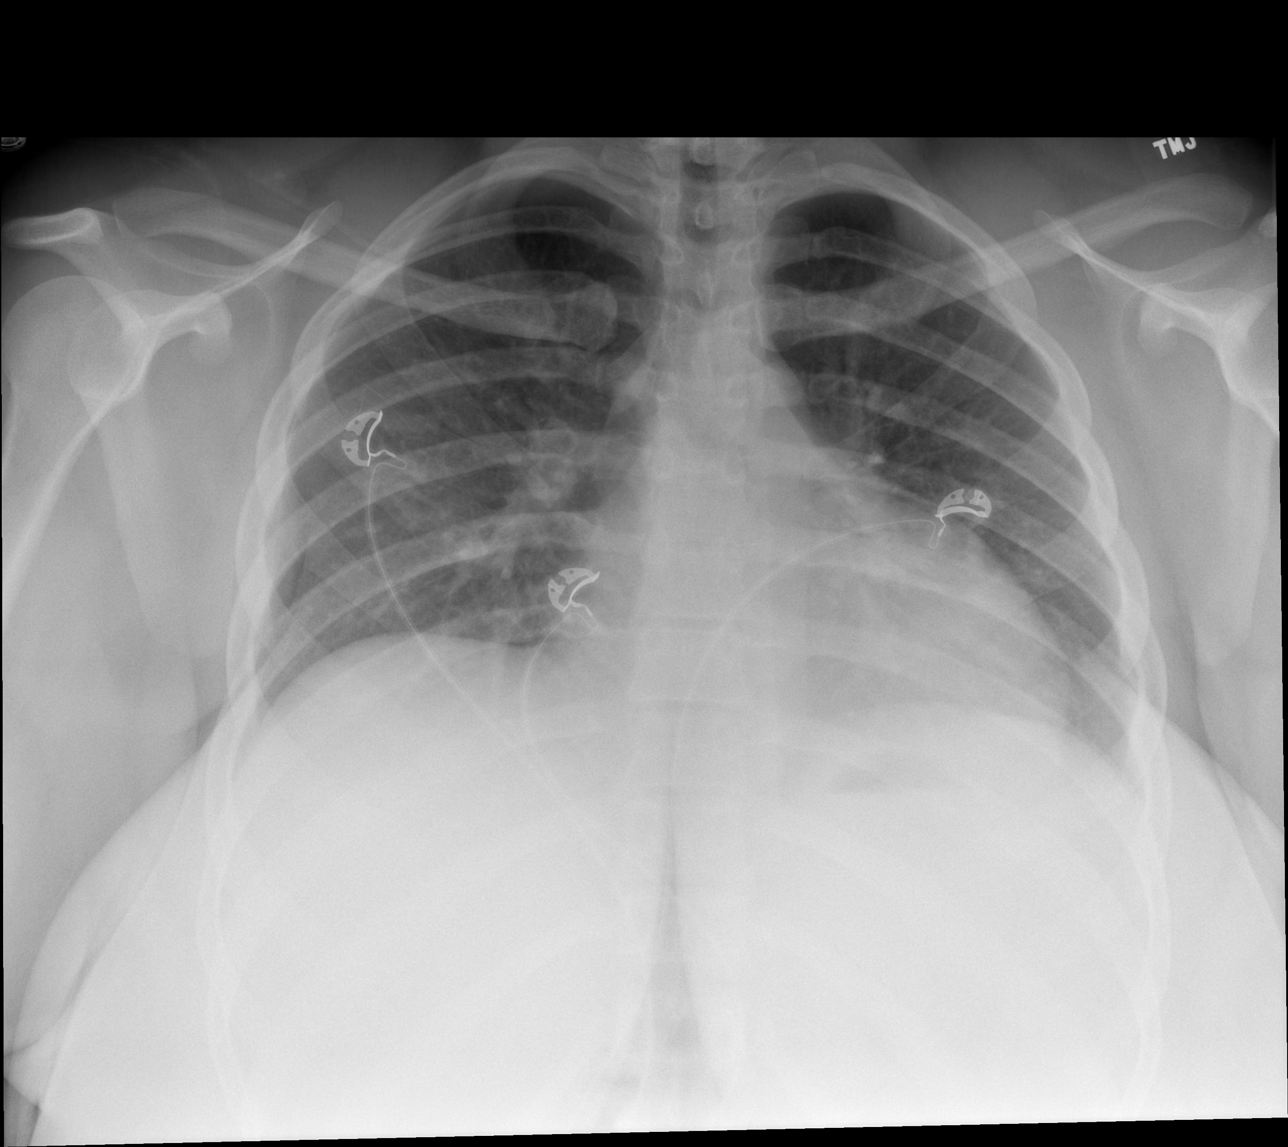

[w chest pa (2 of 2)]
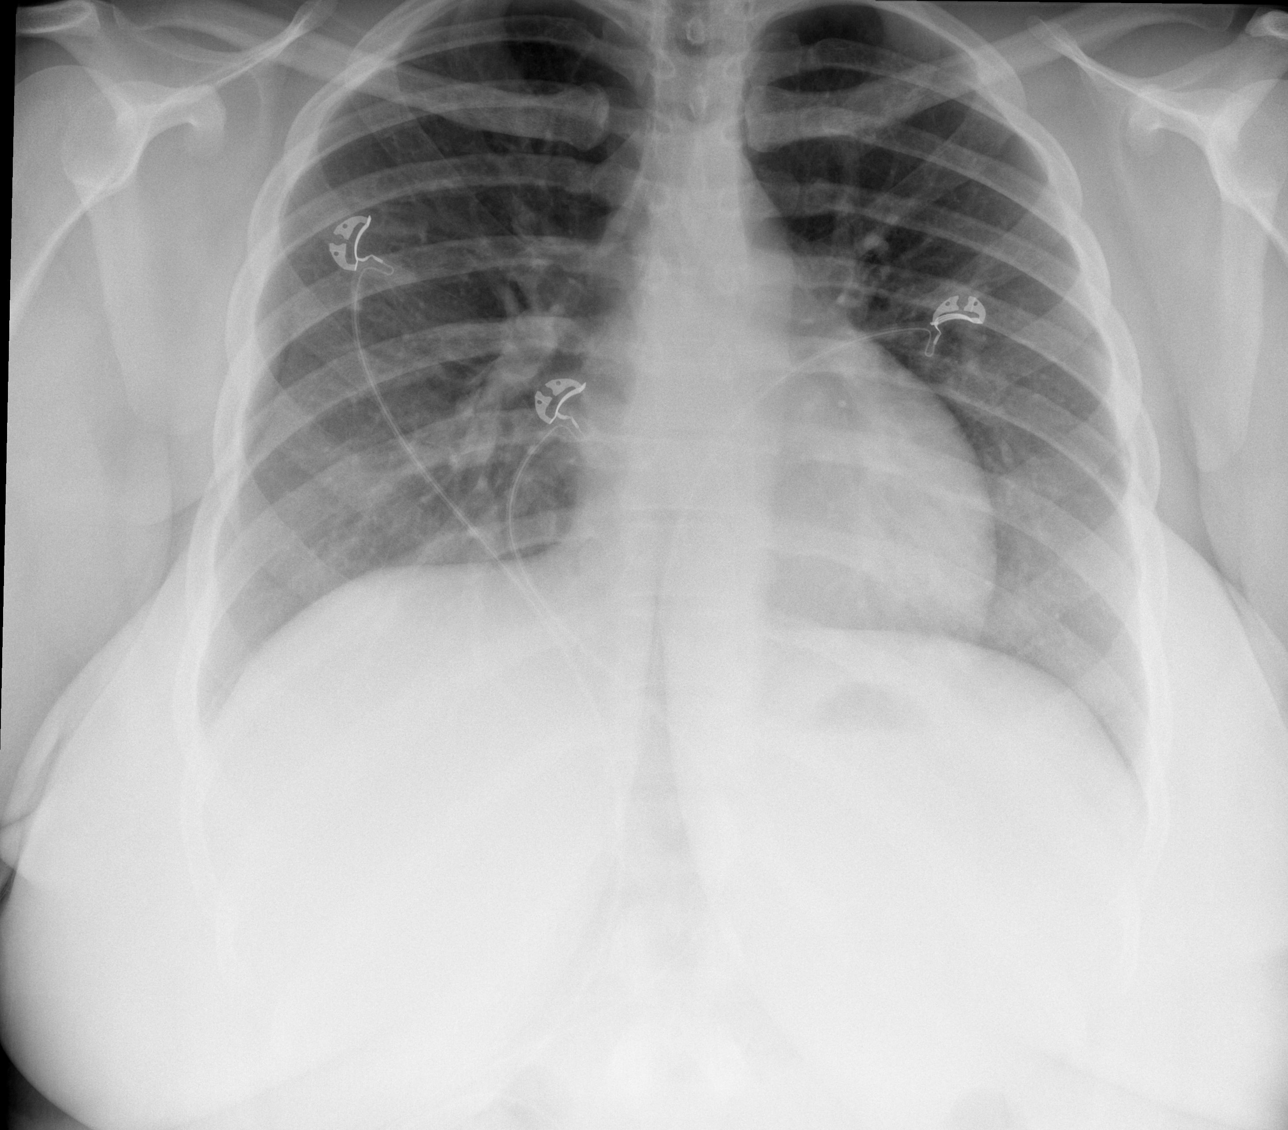

[w chest lat]
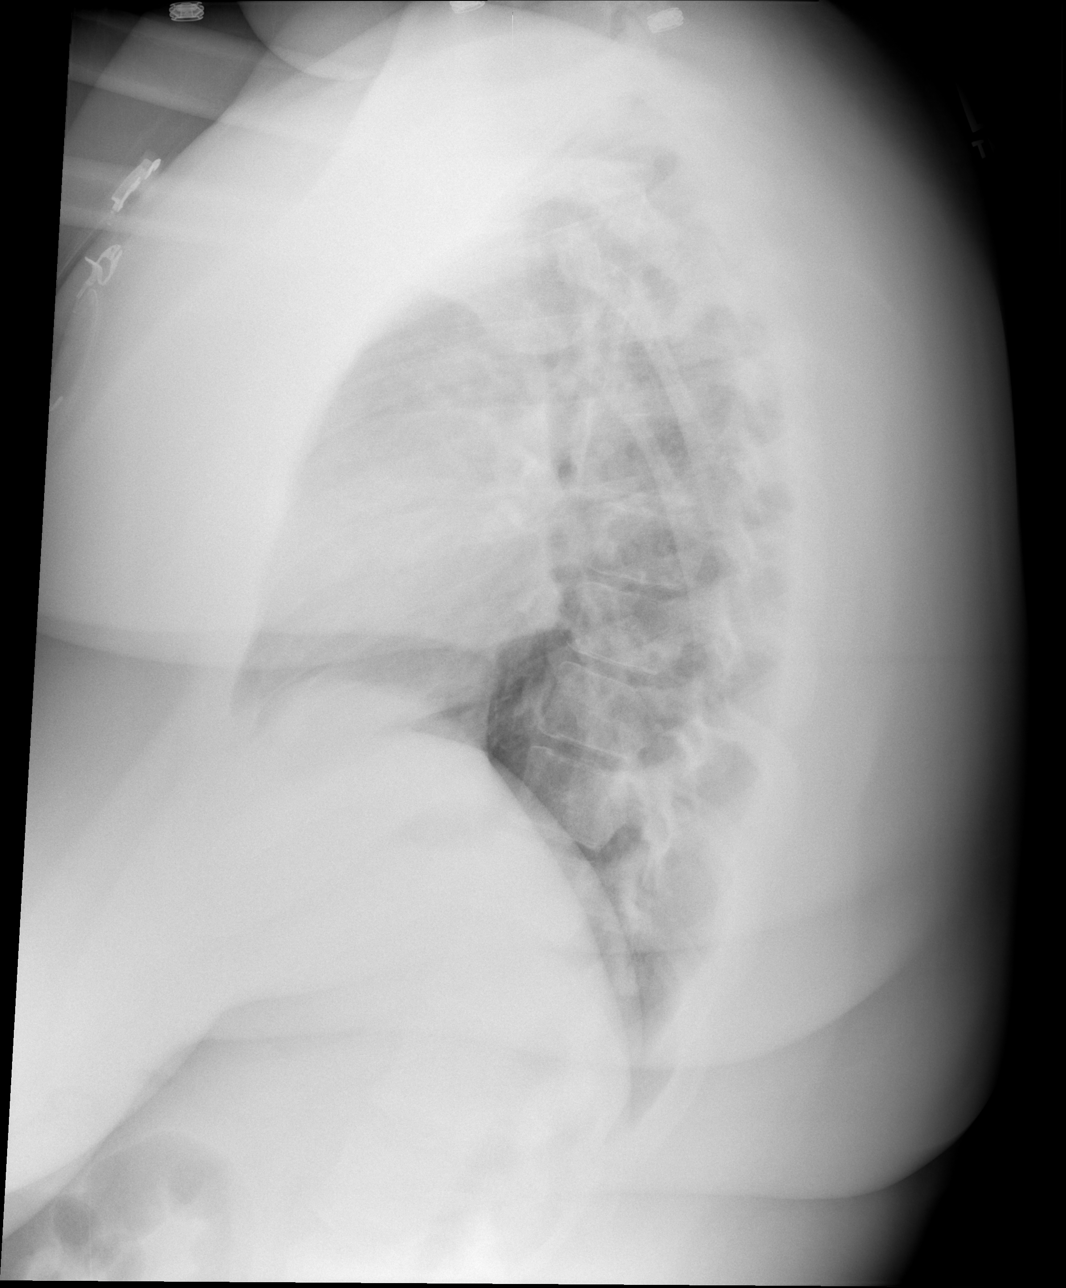

[3 of 3 positions shown; findings below may reference images not displayed]

FINDINGS: Lung volumes are normal.  No consolidative airspace
disease.  No pleural effusions.  No pneumothorax.  No pulmonary
nodule or mass noted.  Pulmonary vasculature and the
cardiomediastinal silhouette are within normal limits.   Lucency
projecting over the lateral aspect of the right hemithorax as
compatible with a skin fold.
IMPRESSION: 1. No radiographic evidence of acute cardiopulmonary disease.

## 2014-09-13 ENCOUNTER — Encounter (HOSPITAL_COMMUNITY): Payer: Self-pay | Admitting: *Deleted

## 2014-09-13 ENCOUNTER — Inpatient Hospital Stay (HOSPITAL_COMMUNITY)
Admission: AD | Admit: 2014-09-13 | Discharge: 2014-09-13 | Disposition: A | Discharge status: Home / Self Care | Payer: Federal, State, Local not specified - PPO | Source: Ambulatory Visit | Attending: Obstetrics and Gynecology | Admitting: Obstetrics and Gynecology

## 2014-09-13 DIAGNOSIS — O98319 Other infections with a predominantly sexual mode of transmission complicating pregnancy, unspecified trimester: Secondary | ICD-10-CM | POA: Diagnosis not present

## 2014-09-13 DIAGNOSIS — Z87891 Personal history of nicotine dependence: Secondary | ICD-10-CM | POA: Insufficient documentation

## 2014-09-13 DIAGNOSIS — Z3A00 Weeks of gestation of pregnancy not specified: Secondary | ICD-10-CM | POA: Diagnosis not present

## 2014-09-13 DIAGNOSIS — O23591 Infection of other part of genital tract in pregnancy, first trimester: Secondary | ICD-10-CM

## 2014-09-13 DIAGNOSIS — A5901 Trichomonal vulvovaginitis: Secondary | ICD-10-CM | POA: Insufficient documentation

## 2014-09-13 DIAGNOSIS — O209 Hemorrhage in early pregnancy, unspecified: Secondary | ICD-10-CM | POA: Insufficient documentation

## 2014-09-13 DIAGNOSIS — N760 Acute vaginitis: Secondary | ICD-10-CM

## 2014-09-13 DIAGNOSIS — B9689 Other specified bacterial agents as the cause of diseases classified elsewhere: Secondary | ICD-10-CM

## 2014-09-13 DIAGNOSIS — O23599 Infection of other part of genital tract in pregnancy, unspecified trimester: Secondary | ICD-10-CM

## 2014-09-13 DIAGNOSIS — N939 Abnormal uterine and vaginal bleeding, unspecified: Secondary | ICD-10-CM | POA: Diagnosis present

## 2014-09-13 LAB — WET PREP, GENITAL
WBC, Wet Prep HPF POC: NONE SEEN
Yeast Wet Prep HPF POC: NONE SEEN

## 2014-09-13 LAB — URINALYSIS, ROUTINE W REFLEX MICROSCOPIC
Bilirubin Urine: NEGATIVE
GLUCOSE, UA: NEGATIVE mg/dL
KETONES UR: NEGATIVE mg/dL
LEUKOCYTES UA: NEGATIVE
Nitrite: NEGATIVE
Protein, ur: NEGATIVE mg/dL
SPECIFIC GRAVITY, URINE: 1.025 (ref 1.005–1.030)
UROBILINOGEN UA: 0.2 mg/dL (ref 0.0–1.0)
pH: 5.5 (ref 5.0–8.0)

## 2014-09-13 LAB — URINE MICROSCOPIC-ADD ON

## 2014-09-13 LAB — HCG, QUANTITATIVE, PREGNANCY: HCG, BETA CHAIN, QUANT, S: 738 m[IU]/mL — AB (ref <5)

## 2014-09-13 LAB — POCT PREGNANCY, URINE: PREG TEST UR: POSITIVE — AB

## 2014-09-13 MED ORDER — METRONIDAZOLE 0.75 % VA GEL: 1.0000 | Freq: Every day | VAGINAL | Status: DC

## 2014-09-13 MED ORDER — METRONIDAZOLE 500 MG PO TABS
2000.0000 mg | ORAL_TABLET | Freq: Once | ORAL | Status: AC
  Administered 2014-09-13: 2000 mg via ORAL
  Filled 2014-09-13: qty 4

## 2014-09-13 NOTE — MAU Provider Note (Signed)
History    Stacey May is a 24y.o. G0 who presents, unannounced, for vaginal bleeding after positive UPT.  Patient states bleeding started Wednesday with wiping.  Patient states bleeding heavy, requiring need to wear pad, today.  Patient reports cramping earlier today and yesterday, but none current. LMP of 08/01/2014 Definite.  Patient denies recent sexual intercourse, problems with bowels, and/or urination.  Patient denies issues with vaginal discharge, but reports ongoing issues with BV. Patient states she is on birth control pill. Patient states she stopped pills on July 13th.    Patient Active Problem List   Diagnosis Date Noted  . First trimester bleeding 09/13/2014    No chief complaint on file.  HPI  OB History    Gravida Para Term Preterm AB TAB SAB Ectopic Multiple Living   0         0      Past Medical History  Diagnosis Date  . Asthma     Past Surgical History  Procedure Laterality Date  . Breast tumor      Family History  Problem Relation Age of Onset  . Heart disease Maternal Grandmother   . Thyroid disease Maternal Grandmother   . Cancer Maternal Grandmother     breast and ovarian  . Heart disease Maternal Grandfather   . Stroke Maternal Grandfather   . Eclampsia Father   . Seizures Father   . Hyperlipidemia Father   . Thyroid disease Mother   . Migraines Sister     History  Substance Use Topics  . Smoking status: Former Games developer  . Smokeless tobacco: Never Used  . Alcohol Use: Yes     Comment: sometime LAST DRANK -   SAT    Allergies:  Allergies  Allergen Reactions  . Almond Oil Shortness Of Breath    ALLERGIC  TO ALMONDS -     Prescriptions prior to admission  Medication Sig Dispense Refill Last Dose  . norethindrone-ethinyl estradiol (JUNEL FE,GILDESS FE,LOESTRIN FE) 1-20 MG-MCG tablet Take 1 tablet by mouth daily. 1 Package 11 08/28/2014  . [DISCONTINUED] amoxicillin-clavulanate (AUGMENTIN) 875-125 MG per tablet Take 1 tablet by mouth 2  (two) times daily. 20 tablet 0 More than a month at Unknown time  . [DISCONTINUED] ibuprofen (ADVIL,MOTRIN) 800 MG tablet Take 1 tablet (800 mg total) by mouth every 8 (eight) hours as needed. 30 tablet 0 More than a month at Unknown time    ROS  See HPI Above Physical Exam   Blood pressure 156/86, pulse 87, temperature 98.3 F (36.8 C), temperature source Oral, resp. rate 18, height  (1.676 m), weight 156.491 kg (345 lb).  Results for orders placed or performed during the hospital encounter of 09/13/14 (from the past 24 hour(s))  Urinalysis, Routine w reflex microscopic (not at [facility])     Status: Abnormal   Collection Time: 09/13/14  8:40 PM  Result Value Ref Range   Color, Urine RED (A) YELLOW   APPearance CLEAR CLEAR   Specific Gravity, Urine 1.025 1.005 - 1.030   pH 5.5 5.0 - 8.0   Glucose, UA NEGATIVE NEGATIVE mg/dL   Hgb urine dipstick LARGE (A) NEGATIVE   Bilirubin Urine NEGATIVE NEGATIVE   Ketones, ur NEGATIVE NEGATIVE mg/dL   Protein, ur NEGATIVE NEGATIVE mg/dL   Urobilinogen, UA 0.2 0.0 - 1.0 mg/dL   Nitrite NEGATIVE NEGATIVE   Leukocytes, UA NEGATIVE NEGATIVE  Urine microscopic-add on     Status: Abnormal   Collection Time: 09/13/14  8:40 PM  Result Value Ref Range   Squamous Epithelial / LPF FEW (A) RARE   WBC, UA 0-2 <3 WBC/hpf   RBC / HPF TOO NUMEROUS TO COUNT <3 RBC/hpf   Bacteria, UA RARE RARE   Urine-Other URINALYSIS PERFORMED ON SUPERNATANT   Pregnancy, urine POC     Status: Abnormal   Collection Time: 09/13/14  9:13 PM  Result Value Ref Range   Preg Test, Ur POSITIVE (A) NEGATIVE  hCG, quantitative, pregnancy     Status: Abnormal   Collection Time: 09/13/14  9:20 PM  Result Value Ref Range   hCG, Beta Chain, Quant, S 738 (H) <5 mIU/mL  Wet prep, genital     Status: Abnormal   Collection Time: 09/13/14  9:54 PM  Result Value Ref Range   Yeast Wet Prep HPF POC NONE SEEN NONE SEEN   Trich, Wet Prep FEW (A) NONE SEEN   Clue Cells Wet Prep HPF POC FEW  (A) NONE SEEN   WBC, Wet Prep HPF POC NONE SEEN NONE SEEN    Physical Exam  Vitals reviewed. Constitutional: She is oriented to person, place, and time. She appears well-developed.  Morbid Obesity  HENT:  Head: Normocephalic and atraumatic.  Eyes: EOM are normal. Pupils are equal, round, and reactive to light.  Neck: Normal range of motion.  Cardiovascular: Normal rate, regular rhythm and normal heart sounds.   Respiratory: Effort normal and breath sounds normal.  GI: Soft. Bowel sounds are normal. There is no tenderness.  Genitourinary: Cervix exhibits friability. Cervix exhibits no motion tenderness. No bleeding in the vagina.  Speculum Exam: -Vulva: Scant amt dk red blood noted at introitus.  Labia majora appears red and inflamed, bilaterally.  Labia minora swollen bilaterally, but soft, no pain elicited.  -Vaginal Vault: No active bleeding noted. Mucoid type discharge-wet prep collected -Cervix: Appears closed, white spotty appearance, scant amt blood from os when touched-GC/CT collected Bimanual Exam: Closed/Long/Thick/Ballotable  Musculoskeletal: Normal range of motion.  Neurological: She is alert and oriented to person, place, and time.  Skin: Skin is warm and dry.    ED Course  Assessment: Positive UPT Vaginal Bleeding  Plan: -PE as above -Labs: Wet prep, GC/CT, HcG, UA -Questions and concerns addressed  Follow Up (2244) -Wet prep: +Clue, +Trich -HcG: 738 -Discussed results -Education given regarding trich diagnosis, vaginal hygiene, and bleeding -Patient instructed to follow up with office for repeat labs and dating [place]; to call and make lab appt for Tuesday -Questions and concerns addressed -Treat trich now: 2000mg  flagyl once -Rx: Metrogel 0.75% insert each night for 5 nights -Bleeding Precautions -Encouraged to call if any questions or concerns arise prior to next scheduled office visit.  -Discharged to home in stable condition  Dakotah Heiman LYNN CNM,  MSN 09/13/2014 9:25 PM

## 2014-09-13 NOTE — MAU Note (Signed)
PT  SAYS SHE GOES  TO [facility]- LAST SEEN 11-2013  - SHE SAW POWELL, NP.    LMP- 6-16.   HPT DONE 7-25  X4  -  POSITIVE .  BIRTH CONTROL-  PILLS   - DID NOT MISS ANY-  GIVEN    BY  DR BLAND .     LAST SEX-  Tuesday.   VAG BLEEDING STARTED  LAST SAT-  AND INCREASE  TODAY AT WORK - HEAVY LIKE  CYCLE-.   WORE PAD TO HOSPITAL  - SMALL AMT LIGHT PINK  .  WAS CRAMPING- BUT NONE  NOW  This SmartLink has not been configured with any valid records.

## 2014-09-13 NOTE — Discharge Instructions (Signed)
Vaginal Bleeding During Pregnancy, First Trimester A small amount of bleeding (spotting) from the vagina is relatively common in early pregnancy. It usually stops on its own. Various things may cause bleeding or spotting in early pregnancy. Some bleeding may be related to the pregnancy, and some may not. In most cases, the bleeding is normal and is not a problem. However, bleeding can also be a sign of something serious. Be sure to tell your health care provider about any vaginal bleeding right away. Some possible causes of vaginal bleeding during the first trimester include:  Infection or inflammation of the cervix.  Growths (polyps) on the cervix.  Miscarriage or threatened miscarriage.  Pregnancy tissue has developed outside of the uterus and in a fallopian tube (tubal pregnancy).  Tiny cysts have developed in the uterus instead of pregnancy tissue (molar pregnancy). HOME CARE INSTRUCTIONS  Watch your condition for any changes. The following actions may help to lessen any discomfort you are feeling:  Follow your health care provider's instructions for limiting your activity. If your health care provider orders bed rest, you may need to stay in bed and only get up to use the bathroom. However, your health care provider may allow you to continue light activity.  If needed, make plans for someone to help with your regular activities and responsibilities while you are on bed rest.  Keep track of the number of pads you use each day, how often you change pads, and how soaked (saturated) they are. Write this down.  Do not use tampons. Do not douche.  Do not have sexual intercourse or orgasms until approved by your health care provider.  If you pass any tissue from your vagina, save the tissue so you can show it to your health care provider.  Only take over-the-counter or prescription medicines as directed by your health care provider.  Do not take aspirin because it can make you  bleed.  Keep all follow-up appointments as directed by your health care provider. SEEK MEDICAL CARE IF:  You have any vaginal bleeding during any part of your pregnancy.  You have cramps or labor pains.  You have a fever, not controlled by medicine. SEEK IMMEDIATE MEDICAL CARE IF:   You have severe cramps in your back or belly (abdomen).  You pass large clots or tissue from your vagina.  Your bleeding increases.  You feel light-headed or weak, or you have fainting episodes.  You have chills.  You are leaking fluid or have a gush of fluid from your vagina.  You pass out while having a bowel movement. MAKE SURE YOU:  Understand these instructions.  Will watch your condition.  Will get help right away if you are not doing well or get worse. Document Released: 11/11/2004 Document Revised: 02/06/2013 Document Reviewed: 10/09/2012 [facility] Patient Information 2015 [place], [place]. This information is not intended to replace advice given to you by your health care provider. Make sure you discuss any questions you have with your health care provider. Trichomoniasis Trichomoniasis is an infection caused by an organism called Trichomonas. The infection can affect both women and men. In women, the outer female genitalia and the vagina are affected. In men, the penis is mainly affected, but the prostate and other reproductive organs can also be involved. Trichomoniasis is a sexually transmitted infection (STI) and is most often passed to another person through sexual contact.  RISK FACTORS  Having unprotected sexual intercourse.  Having sexual intercourse with an infected partner. SIGNS AND SYMPTOMS  Symptoms of trichomoniasis in women include: °· Abnormal gray-green frothy vaginal discharge. °· Itching and irritation of the vagina. °· Itching and irritation of the area outside the vagina. °Symptoms of trichomoniasis in men include:  °· Penile discharge with or without pain. °· Pain  during urination. This results from inflammation of the urethra. °DIAGNOSIS  °Trichomoniasis may be found during a Pap test or physical exam. Your health care provider may use one of the following methods to help diagnose this infection: °· Examining vaginal discharge under a microscope. For men, urethral discharge would be examined. °· Testing the pH of the vagina with a test tape. °· Using a vaginal swab test that checks for the Trichomonas organism. A test is available that provides results within a few minutes. °· Doing a culture test for the organism. This is not usually needed. °TREATMENT  °· You may be given medicine to fight the infection. Women should inform their health care provider if they could be or are pregnant. Some medicines used to treat the infection should not be taken during pregnancy. °· Your health care provider may recommend over-the-counter medicines or creams to decrease itching or irritation. °· Your sexual partner will need to be treated if infected. °HOME CARE INSTRUCTIONS  °· Take medicines only as directed by your health care provider. °· Take over-the-counter medicine for itching or irritation as directed by your health care provider. °· Do not have sexual intercourse while you have the infection. °· Women should not douche or wear tampons while they have the infection. °· Discuss your infection with your partner. Your partner may have gotten the infection from you, or you may have gotten it from your partner. °· Have your sex partner get examined and treated if necessary. °· Practice safe, informed, and protected sex. °· See your health care provider for other STI testing. °SEEK MEDICAL CARE IF:  °· You still have symptoms after you finish your medicine. °· You develop abdominal pain. °· You have pain when you urinate. °· You have bleeding after sexual intercourse. °· You develop a rash. °· Your medicine makes you sick or makes you throw up (vomit). °MAKE SURE YOU: °· Understand  these instructions. °· Will watch your condition. °· Will get help right away if you are not doing well or get worse. °Document Released: 07/28/2000 Document Revised: 06/18/2013 Document Reviewed: 11/13/2012 °Exit[employer]® Patient Information ©2015 [employer]. This information is not intended to replace advice given to you by your health care provider. Make sure you discuss any questions you have with your health care provider. ° °

## 2014-09-13 NOTE — MAU Note (Signed)
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

## 2014-09-16 LAB — GC/CHLAMYDIA PROBE AMP (~~LOC~~) NOT AT ARMC
Chlamydia: NEGATIVE
Neisseria Gonorrhea: NEGATIVE

## 2014-10-17 ENCOUNTER — Encounter (HOSPITAL_COMMUNITY): Payer: Self-pay | Admitting: Emergency Medicine

## 2014-10-17 ENCOUNTER — Emergency Department (HOSPITAL_COMMUNITY)
Admission: EM | Admit: 2014-10-17 | Discharge: 2014-10-18 | Disposition: A | Discharge status: Home / Self Care | Payer: Federal, State, Local not specified - PPO | Attending: Physician Assistant | Admitting: Physician Assistant

## 2014-10-17 ENCOUNTER — Emergency Department (INDEPENDENT_AMBULATORY_CARE_PROVIDER_SITE_OTHER)
Admission: EM | Admit: 2014-10-17 | Discharge: 2014-10-17 | Discharge status: Against Medical Advice | Payer: Federal, State, Local not specified - PPO | Source: Home / Self Care | Attending: Family Medicine | Admitting: Family Medicine

## 2014-10-17 DIAGNOSIS — Z87891 Personal history of nicotine dependence: Secondary | ICD-10-CM | POA: Insufficient documentation

## 2014-10-17 DIAGNOSIS — L02213 Cutaneous abscess of chest wall: Secondary | ICD-10-CM

## 2014-10-17 DIAGNOSIS — Z79899 Other long term (current) drug therapy: Secondary | ICD-10-CM | POA: Insufficient documentation

## 2014-10-17 DIAGNOSIS — R509 Fever, unspecified: Secondary | ICD-10-CM | POA: Insufficient documentation

## 2014-10-17 DIAGNOSIS — J45909 Unspecified asthma, uncomplicated: Secondary | ICD-10-CM | POA: Insufficient documentation

## 2014-10-17 DIAGNOSIS — L03313 Cellulitis of chest wall: Secondary | ICD-10-CM | POA: Diagnosis present

## 2014-10-17 LAB — CBC WITH DIFFERENTIAL/PLATELET
BASOS ABS: 0 10*3/uL (ref 0.0–0.1)
BASOS PCT: 0 % (ref 0–1)
EOS ABS: 0.3 10*3/uL (ref 0.0–0.7)
Eosinophils Relative: 2 % (ref 0–5)
HEMATOCRIT: 38.5 % (ref 36.0–46.0)
HEMOGLOBIN: 12.9 g/dL (ref 12.0–15.0)
Lymphocytes Relative: 30 % (ref 12–46)
Lymphs Abs: 4.3 10*3/uL — ABNORMAL HIGH (ref 0.7–4.0)
MCH: 26.9 pg (ref 26.0–34.0)
MCHC: 33.5 g/dL (ref 30.0–36.0)
MCV: 80.2 fL (ref 78.0–100.0)
Monocytes Absolute: 1.1 10*3/uL — ABNORMAL HIGH (ref 0.1–1.0)
Monocytes Relative: 8 % (ref 3–12)
NEUTROS ABS: 8.6 10*3/uL — AB (ref 1.7–7.7)
NEUTROS PCT: 60 % (ref 43–77)
Platelets: 243 10*3/uL (ref 150–400)
RBC: 4.8 MIL/uL (ref 3.87–5.11)
RDW: 14.6 % (ref 11.5–15.5)
WBC: 14.3 10*3/uL — AB (ref 4.0–10.5)

## 2014-10-17 LAB — I-STAT CHEM 8, ED
BUN: 9 mg/dL (ref 6–20)
CREATININE: 1.1 mg/dL — AB (ref 0.44–1.00)
Calcium, Ion: 1.21 mmol/L (ref 1.12–1.23)
Chloride: 99 mmol/L — ABNORMAL LOW (ref 101–111)
Glucose, Bld: 129 mg/dL — ABNORMAL HIGH (ref 65–99)
HEMATOCRIT: 43 % (ref 36.0–46.0)
HEMOGLOBIN: 14.6 g/dL (ref 12.0–15.0)
POTASSIUM: 4.7 mmol/L (ref 3.5–5.1)
SODIUM: 139 mmol/L (ref 135–145)
TCO2: 26 mmol/L (ref 0–100)

## 2014-10-17 LAB — URINALYSIS, ROUTINE W REFLEX MICROSCOPIC
BILIRUBIN URINE: NEGATIVE
Glucose, UA: NEGATIVE mg/dL
HGB URINE DIPSTICK: NEGATIVE
Ketones, ur: NEGATIVE mg/dL
Leukocytes, UA: NEGATIVE
NITRITE: NEGATIVE
PROTEIN: NEGATIVE mg/dL
SPECIFIC GRAVITY, URINE: 1.01 (ref 1.005–1.030)
UROBILINOGEN UA: 0.2 mg/dL (ref 0.0–1.0)
pH: 7 (ref 5.0–8.0)

## 2014-10-17 LAB — I-STAT CG4 LACTIC ACID, ED: Lactic Acid, Venous: 1.19 mmol/L (ref 0.5–2.0)

## 2014-10-17 MED ORDER — ACETAMINOPHEN 325 MG PO TABS
ORAL_TABLET | ORAL | Status: AC
  Filled 2014-10-17: qty 2

## 2014-10-17 MED ORDER — IBUPROFEN 800 MG PO TABS
800.0000 mg | ORAL_TABLET | Freq: Once | ORAL | Status: AC
  Administered 2014-10-17: 800 mg via ORAL
  Filled 2014-10-17: qty 1

## 2014-10-17 MED ORDER — CLINDAMYCIN PHOSPHATE 600 MG/50ML IV SOLN
600.0000 mg | Freq: Once | INTRAVENOUS | Status: AC
  Administered 2014-10-17: 600 mg via INTRAVENOUS
  Filled 2014-10-17: qty 50

## 2014-10-17 MED ORDER — ACETAMINOPHEN 325 MG PO TABS
650.0000 mg | ORAL_TABLET | Freq: Once | ORAL | Status: AC
  Administered 2014-10-17: 650 mg via ORAL

## 2014-10-17 MED ORDER — MORPHINE SULFATE (PF) 4 MG/ML IV SOLN
4.0000 mg | Freq: Once | INTRAVENOUS | Status: AC
  Administered 2014-10-17: 4 mg via INTRAVENOUS
  Filled 2014-10-17: qty 1

## 2014-10-17 MED ORDER — SODIUM CHLORIDE 0.9 % IV BOLUS (SEPSIS)
1000.0000 mL | Freq: Once | INTRAVENOUS | Status: AC
  Administered 2014-10-17: 1000 mL via INTRAVENOUS

## 2014-10-17 NOTE — ED Notes (Signed)
C/o abscess on right side of abdomen area States area is sore  Denies any drainage Warm salt water used as tx

## 2014-10-17 NOTE — ED Notes (Signed)
Pt c/o abscess on right lat upper abd x's 3-4 days.  St's area has gotton bigger and more painful.  Pt was [facility] ED from [facility]. Pt was given Tylenol at [facility]gent  [facility]

## 2014-10-17 NOTE — ED Notes (Signed)
Unable to start IV, IV team consult placed.

## 2014-10-17 NOTE — ED Provider Notes (Addendum)
CSN: [id number]     Arrival date & time 10/17/14  1829 History   First MD Initiated Contact with Patient 10/17/14 1900     Chief Complaint  Patient presents with  . Abscess   (Consider location/radiation/quality/duration/timing/severity/associated sxs/prior Treatment) Patient is a 24 y.o. female presenting with abscess. The history is provided by the patient.  Abscess Location:  Torso Torso abscess location:  R chest Abscess quality: fluctuance, induration, painful, redness and warmth   Red streaking: no   Duration:  2 days Progression:  Worsening Chronicity:  New Relieved by:  None tried Worsened by:  Nothing tried Ineffective treatments:  None tried Associated symptoms: fever   Risk factors: prior abscess     Past Medical History  Diagnosis Date  . Asthma    Past Surgical History  Procedure Laterality Date  . Breast tumor     Family History  Problem Relation Age of Onset  . Heart disease Maternal Grandmother   . Thyroid disease Maternal Grandmother   . Cancer Maternal Grandmother     breast and ovarian  . Heart disease Maternal Grandfather   . Stroke Maternal Grandfather   . Eclampsia Father   . Seizures Father   . Hyperlipidemia Father   . Thyroid disease Mother   . Migraines Sister    Social History  Substance Use Topics  . Smoking status: Former Games developer  . Smokeless tobacco: Never Used  . Alcohol Use: Yes     Comment: sometime LAST DRANK -   SAT   OB History    Gravida Para Term Preterm AB TAB SAB Ectopic Multiple Living   1         0     Review of Systems  Constitutional: Positive for fever and chills.  Skin: Positive for rash.    Allergies  Almond oil  Home Medications   Prior to Admission medications   Medication Sig Start Date End Date Taking? Authorizing Provider  metroNIDAZOLE (METROGEL VAGINAL) 0.75 % vaginal gel Place 1 Applicatorful vaginally at bedtime. Insert one applicator, at bedtime, for 5 nights. 09/13/14   Gerrit Heck, CNM    Meds Ordered and Administered this Visit   Medications  acetaminophen (TYLENOL) tablet 650 mg (650 mg Oral Given 10/17/14 1904)    Pulse 124  Temp(Src) 103 F (39.4 C) (Oral)  Resp 18  SpO2 100%  LMP 08/01/2014  Breastfeeding? Unknown No data found.   Physical Exam  Constitutional: She is oriented to person, place, and time. She appears well-developed and well-nourished. She appears distressed.  Pulmonary/Chest: She exhibits tenderness.  Neurological: She is alert and oriented to person, place, and time.  Skin: Skin is warm and dry. Rash noted.  Tender indurated fluctuant 4x5cm erythem abscess to right lat chest wall breast.  Nursing note and vitals reviewed.   ED Course  Procedures (including critical care time)  Labs Review Labs Reviewed - No data to display  Imaging Review No results found.   Visual Acuity Review  Right Eye Distance:   Left Eye Distance:   Bilateral Distance:    Right Eye Near:   Left Eye Near:    Bilateral Near:         MDM   1. Abscess of chest wall    Sent for surgical eval of right chest abscess with t103.    Linna Hoff, MD 10/17/14 [place]  Linna Hoff, MD 10/17/14 [phone]

## 2014-10-17 NOTE — ED Provider Notes (Signed)
CSN: [id number]     Arrival date & time 10/17/14  2013 History   First MD Initiated Contact with Patient 10/17/14 2106     Chief Complaint  Patient presents with  . Abscess     (Consider location/radiation/quality/duration/timing/severity/associated sxs/prior Treatment) HPI Comments: Stacey May is a 24 y.o. female with a PMHx of asthma, who presents to the ED with complaints of cellulitis to the right lateral chest wall 3 days. She was referred here from urgent care due to having a temperature of 103 as well as this abscess/cellulitis. She reports she has 10/10 constant dull pain in the area of the cellulitic region, nonradiating, worse with pressure to the area, and unimproved with "boil-ease", warm soaks, and Epsom salt baths. Associated symptoms include erythema, warmth, and fever with Tmax 103 which improved with tylenol now down to 100.7. She denies any red streaking, drainage, fluctuance, chest pain, shortness breath, abdominal pain, nausea, vomiting, diarrhea, constipation, dysuria, hematuria, vaginal bleeding or discharge, numbness, tingling, weakness, cough, or URI symptoms. She reports she has had prior abscesses in the past, stating that she gets them often in her axilla. She denies any known insect bites or skin injury, but states that this is where her bra rubs. PCP: Geraldo Pitter, MD   Patient is a 24 y.o. female presenting with abscess. The history is provided by the patient and medical records. No language interpreter was used.  Abscess Location:  Torso Torso abscess location:  R chest Abscess quality: induration, painful, redness and warmth   Abscess quality: not draining and no fluctuance   Red streaking: no   Duration:  3 days Progression:  Worsening Pain details:    Quality:  Dull   Duration:  3 days   Timing:  Constant   Progression:  Worsening Chronicity:  Recurrent Context: skin injury   Relieved by:  Nothing Exacerbated by: pressure to area. Ineffective  treatments:  Warm compresses (epsom salt soaks, boil-eeze, and warm soaks) Associated symptoms: fever   Associated symptoms: no nausea and no vomiting   Risk factors: prior abscess     Past Medical History  Diagnosis Date  . Asthma    Past Surgical History  Procedure Laterality Date  . Breast tumor     Family History  Problem Relation Age of Onset  . Heart disease Maternal Grandmother   . Thyroid disease Maternal Grandmother   . Cancer Maternal Grandmother     breast and ovarian  . Heart disease Maternal Grandfather   . Stroke Maternal Grandfather   . Eclampsia Father   . Seizures Father   . Hyperlipidemia Father   . Thyroid disease Mother   . Migraines Sister    Social History  Substance Use Topics  . Smoking status: Former Games developer  . Smokeless tobacco: Never Used  . Alcohol Use: Yes     Comment: sometime LAST DRANK -   SAT   OB History    Gravida Para Term Preterm AB TAB SAB Ectopic Multiple Living   1         0     Review of Systems  Constitutional: Positive for fever.  HENT: Negative for ear discharge, ear pain, rhinorrhea and sore throat.   Respiratory: Negative for cough and shortness of breath.   Cardiovascular: Negative for chest pain.  Gastrointestinal: Negative for nausea, vomiting, abdominal pain, diarrhea and constipation.  Genitourinary: Negative for dysuria, hematuria, vaginal bleeding and vaginal discharge.  Musculoskeletal: Negative for myalgias and arthralgias.  Skin: Positive for color  change and wound (abscess).  Allergic/Immunologic: Negative for immunocompromised state.  Neurological: Negative for weakness and numbness.  Psychiatric/Behavioral: Negative for confusion.   10 Systems reviewed and are negative for acute change except as noted in the HPI.    Allergies  Almond oil  Home Medications   Prior to Admission medications   Medication Sig Start Date End Date Taking? Authorizing Provider  SPRINTEC 28 0.25-35 MG-MCG tablet Take 1  tablet by mouth daily. 08/11/14  Yes Historical Provider, MD   BP 111/72 mmHg  Pulse 133  Temp(Src) 100.7 F (38.2 C) (Oral)  Resp 18  Ht 5\' 8"  (1.727 m)  Wt 345 lb (156.491 kg)  BMI 52.47 kg/m2  SpO2 99%  LMP 08/01/2014 Physical Exam  Constitutional: She is oriented to person, place, and time. She appears well-developed and well-nourished.  Non-toxic appearance. No distress.  Febrile at 100.7, tachycardic in the 130s, but nontoxic, NAD. Obese.   HENT:  Head: Normocephalic and atraumatic.  Mouth/Throat: Oropharynx is clear and moist and mucous membranes are normal.  Eyes: Conjunctivae and EOM are normal. Right eye exhibits no discharge. Left eye exhibits no discharge.  Neck: Normal range of motion. Neck supple.  Cardiovascular: Normal rate, regular rhythm, normal heart sounds and intact distal pulses.  Exam reveals no gallop and no friction rub.   No murmur heard. Pulmonary/Chest: Effort normal and breath sounds normal. No respiratory distress. She has no decreased breath sounds. She has no wheezes. She has no rhonchi. She has no rales. She exhibits tenderness and swelling. She exhibits no crepitus, no deformity and no retraction. Right breast exhibits no skin change and no tenderness.    R chest wall just lateral to R breast, within a skin fold, with 3-4cm area of indurated skin, mildly TTP, erythematous and warm to touch, without fluctuance or red streaking. No drainage. No extension onto R breast.   Abdominal: Soft. Normal appearance and bowel sounds are normal. She exhibits no distension. There is no tenderness. There is no rigidity, no rebound, no guarding, no CVA tenderness, no tenderness at McBurney's point and negative Murphy's sign.  Musculoskeletal: Normal range of motion.  Neurological: She is alert and oriented to person, place, and time. She has normal strength. No sensory deficit.  Skin: Skin is warm, dry and intact. No rash noted. There is erythema.  Erythema and  cellulitis to R lateral chest wall as noted above  Psychiatric: She has a normal mood and affect.  Nursing note and vitals reviewed.   ED Course  Procedures (including critical care time) Labs Review Labs Reviewed  CBC WITH DIFFERENTIAL/PLATELET - Abnormal; Notable for the following:    WBC 14.3 (*)    Neutro Abs 8.6 (*)    Lymphs Abs 4.3 (*)    Monocytes Absolute 1.1 (*)    All other components within normal limits  I-STAT CHEM 8, ED - Abnormal; Notable for the following:    Chloride 99 (*)    Creatinine, Ser 1.10 (*)    Glucose, Bld 129 (*)    All other components within normal limits  URINALYSIS, ROUTINE W REFLEX MICROSCOPIC (NOT AT [facility])  I-STAT CG4 LACTIC ACID, ED    Imaging Review No results found. I have personally reviewed and evaluated these images and lab results as part of my medical decision-making.   EKG Interpretation None      MDM   Final diagnoses:  Cellulitis of chest wall  Fever, unspecified fever cause    24 y.o. female here  with R lateral abdomen/chest wall cellulitis in a skin fold. Pt states she's had multiple abscesses in the past. Febrile at urgent care up to 103, down to 100.7 here after tylenol. HR 133 here. Overall not septic appearing, chem 8 unremarkable, CBC w/diff with mildly elevated WBC 14.3, but lactic acid WNL. Bedside ultrasound without any fluid pockets, no fluctuance on exam. Will give ibuprofen, fluids, clinda, and morphine for pain. Likely home if HR improves and fever improves with fluids/ibuprofen. Will get U/A to r/o other sources of fever. Will reassess shortly.   12:14 AM Pt currently afebrile, VS improved after fluids and ibuprofen, pain improved. Nontoxic appearing, no longer tachycardic. Will send home with clindamycin, and strict f/up with PCP in 2 days to see if this area is amendable to I&D. Will give pain meds. Discussed heat compresses. I explained the diagnosis and have given explicit precautions to return to the ER  including for any other new or worsening symptoms. The patient understands and accepts the medical plan as it's been dictated and I have answered their questions. Discharge instructions concerning home care and prescriptions have been given. The patient is STABLE and is discharged to home in good condition.  BP 112/54 mmHg  Pulse 98  Temp(Src) 98.3 F (36.8 C) (Oral)  Resp 18  Ht 5\' 8"  (1.727 m)  Wt 345 lb (156.491 kg)  BMI 52.47 kg/m2  SpO2 97%  LMP 08/01/2014  Meds ordered this encounter  Medications  . sodium chloride 0.9 % bolus 1,000 mL    Sig:   . ibuprofen (ADVIL,MOTRIN) tablet 800 mg    Sig:   . clindamycin (CLEOCIN) IVPB 600 mg    Sig:     Order Specific Question:  Antibiotic Indication:    Answer:  Cellulitis  . morphine 4 MG/ML injection 4 mg    Sig:   . clindamycin (CLEOCIN) 300 MG capsule    Sig: Take 1 capsule (300 mg total) by mouth 4 (four) times daily. X 7 days    Dispense:  28 capsule    Refill:  0    Order Specific Question:  Supervising Provider    Answer:  MILLER, BRIAN [[id number]]  . HYDROcodone-acetaminophen (NORCO) 5-325 MG per tablet    Sig: Take 1 tablet by mouth every 6 (six) hours as needed for severe pain.    Dispense:  6 tablet    Refill:  0    Order Specific Question:  Supervising Provider    Answer:  MILLER, BRIAN [[id number]]  . naproxen (NAPROSYN) 500 MG tablet    Sig: Take 1 tablet (500 mg total) by mouth 2 (two) times daily as needed for mild pain, moderate pain or headache (TAKE WITH MEALS.).    Dispense:  20 tablet    Refill:  0    Order Specific Question:  Supervising Provider    Answer:  Eber Hong [[id number]]     Carisha Kantor Camprubi-Soms, PA-C 10/18/14 0015  Courteney Randall An, MD 10/18/14 1524

## 2014-10-18 MED ORDER — NAPROXEN 500 MG PO TABS: 500.0000 mg | ORAL_TABLET | Freq: Two times a day (BID) | ORAL | Status: DC | PRN

## 2014-10-18 MED ORDER — CLINDAMYCIN HCL 300 MG PO CAPS: 300.0000 mg | ORAL_CAPSULE | Freq: Four times a day (QID) | ORAL | Status: DC

## 2014-10-18 MED ORDER — HYDROCODONE-ACETAMINOPHEN 5-325 MG PO TABS: 1.0000 | ORAL_TABLET | Freq: Four times a day (QID) | ORAL | Status: DC | PRN

## 2014-10-18 NOTE — ED Notes (Signed)
Pt stable, ambulatory, states understanding of discharge instructions 

## 2014-10-18 NOTE — Discharge Instructions (Signed)
Keep wound clean and dry. Apply warm compresses to affected area throughout the day. Take antibiotic until it is finished. Take naprosyn and norco as directed, as needed for pain but do not drive or operate machinery with pain medication use. Use tylenol/motrin as needed for fevers. Followup with [facility]Primary Care doctor in 2 days for wound recheck. Monitor area for signs of infection to include, but not limited to: increasing pain, spreading redness, drainage/pus, or worsening swelling. Return to emergency department for emergent changing or worsening symptoms.   Cellulitis Cellulitis is an infection of the skin and the tissue under the skin. The infected area is usually red and tender. This happens most often in the arms and lower legs. HOME CARE   Take your antibiotic medicine as told. Finish the medicine even if you start to feel better.  Keep the infected arm or leg raised (elevated).  Put a warm cloth on the area up to 4 times per day.  Only take medicines as told by your doctor.  Keep all doctor visits as told. GET HELP IF:  You see red streaks on the skin coming from the infected area.  Your red area gets bigger or turns a dark color.  Your bone or joint under the infected area is painful after the skin heals.  Your infection comes back in the same area or different area.  You have a puffy (swollen) bump in the infected area.  You have new symptoms.  You have a fever. GET HELP RIGHT AWAY IF:   You feel very sleepy.  You throw up (vomit) or have watery poop (diarrhea).  You feel sick and have muscle aches and pains. MAKE SURE YOU:   Understand these instructions.  Will watch your condition.  Will get help right away if you are not doing well or get worse. Document Released: 07/21/2007 Document Revised: 06/18/2013 Document Reviewed: 04/19/2011 [facility] Patient Information 2015 [place], [place]. This information is not intended to replace advice  given to you by your health care provider. Make sure you discuss any questions you have with your health care provider.  Abscess An abscess (boil or furuncle) is an infected area on or under the skin. This area is filled with yellowish-white fluid (pus) and other material (debris). HOME CARE   Only take medicines as told by your doctor.  If you were given antibiotic medicine, take it as directed. Finish the medicine even if you start to feel better.  If gauze is used, follow your doctor's directions for changing the gauze.  To avoid spreading the infection:  Keep your abscess covered with a bandage.  Wash your hands well.  Do not share personal care items, towels, or whirlpools with others.  Avoid skin contact with others.  Keep your skin and clothes clean around the abscess.  Keep all doctor visits as told. GET HELP RIGHT AWAY IF:   You have more pain, puffiness (swelling), or redness in the wound site.  You have more fluid or blood coming from the wound site.  You have muscle aches, chills, or you feel sick.  You have a fever. MAKE SURE YOU:   Understand these instructions.  Will watch your condition.  Will get help right away if you are not doing well or get worse. Document Released: 07/21/2007 Document Revised: 08/03/2011 Document Reviewed: 04/16/2011 [facility] Patient Information 2015 [place], [place]. This information is not intended to replace advice given to you by your health care provider. Make sure you discuss  any questions you have with your health care provider. ° °

## 2017-04-04 ENCOUNTER — Other Ambulatory Visit: Payer: Self-pay

## 2017-04-04 ENCOUNTER — Encounter (HOSPITAL_COMMUNITY): Payer: Self-pay | Admitting: Emergency Medicine

## 2017-04-04 ENCOUNTER — Ambulatory Visit (HOSPITAL_COMMUNITY)
Admission: EM | Admit: 2017-04-04 | Discharge: 2017-04-04 | Disposition: A | Discharge status: Home / Self Care | Payer: BLUE CROSS/BLUE SHIELD | Attending: Family Medicine | Admitting: Family Medicine

## 2017-04-04 DIAGNOSIS — W19XXXA Unspecified fall, initial encounter: Secondary | ICD-10-CM

## 2017-04-04 DIAGNOSIS — S86912A Strain of unspecified muscle(s) and tendon(s) at lower leg level, left leg, initial encounter: Secondary | ICD-10-CM | POA: Diagnosis not present

## 2017-04-04 NOTE — ED Triage Notes (Signed)
[facility] a [facility] left knee pain x 3 days. The patient stated that she fell 3 days ago.

## 2017-04-04 NOTE — ED Provider Notes (Signed)
[facility]   [id number] 04/04/17 Arrival Time: 1818   SUBJECTIVE:  Stacey May is a 27 y.o. female who presents to the urgent care with complaint of left knee pain x 3 days. The patient stated that she fell 3 days ago.  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  No swelling or ecchymosis.  Able to fully extend and flex to 90 degrees.  No localized tenderness.  Past Medical History:  Diagnosis Date  . Asthma    Family History  Problem Relation Age of Onset  . Heart disease Maternal Grandmother   . Thyroid disease Maternal Grandmother   . Cancer Maternal Grandmother        breast and ovarian  . Heart disease Maternal Grandfather   . Stroke Maternal Grandfather   . Eclampsia Father   . Seizures Father   . Hyperlipidemia Father   . Thyroid disease Mother   . Migraines Sister    Social History   Socioeconomic History  . Marital status: Married    Spouse name: Not on file  . Number of children: Not on file  . Years of education: Not on file  . Highest education level: Not on file  Social Needs  . Financial resource strain: Not on file  . Food insecurity - worry: Not on file  . Food insecurity - inability: Not on file  . Transportation needs - medical: Not on file  . Transportation needs - non-medical: Not on file  Occupational History  . Not on file  Tobacco Use  . Smoking status: Former Games developer  . Smokeless tobacco: Never Used  Substance and Sexual Activity  . Alcohol use: Yes    Comment: sometime LAST DRANK -   SAT  . Drug use: No  . Sexual activity: Yes    Partners: Male    Birth control/protection: Condom, Pill    Comment: junel   Other Topics Concern  . Not on file  Social History Narrative  . Not on file   No outpatient medications have been marked as taking for the 04/04/17 encounter [facility] Encounter).   Allergies  Allergen Reactions  . Almond Oil Shortness Of Breath    ALLERGIC  TO ALMONDS -       ROS:  As per HPI, remainder of ROS negative.   OBJECTIVE:   Vitals:   04/04/17 1911  BP: (!) 153/105  Pulse: (!) 110  Resp: 20  Temp: 99.4 F (37.4 C)  TempSrc: Oral  SpO2: 100%     General appearance: alert; no distress Eyes: PERRL; EOMI; conjunctiva normal HENT: normocephalic; atraumatic; TMs normal, canal normal, external ears normal without trauma; nasal mucosa normal; oral mucosa normal Neck: supple Back: no CVA tenderness Extremities: Left knee:  No swelling or ecchymosis.  Able to fully extend and flex to 90 degrees.  No localized tenderness.  No effusion.  No ligamentous laxity or tenderness.  No bony tenderness. Skin: warm and dry Neurologic: normal gait; grossly normal Psychological: alert and cooperative; normal mood and affect      Labs:  Results for orders placed or performed during the hospital encounter of 10/17/14  CBC with Differential  Result Value Ref Range   WBC 14.3 (H) 4.0 - 10.5 K/uL   RBC 4.80 3.87 - 5.11 MIL/uL   Hemoglobin 12.9 12.0 - 15.0 g/dL   HCT 40.9 81.1 - 91.4 %   MCV 80.2 78.0 - 100.0 fL   MCH 26.9 26.0 -  34.0 pg   MCHC 33.5 30.0 - 36.0 g/dL   RDW 13.014.6 86.511.5 - 78.415.5 %   Platelets 243 150 - 400 K/uL   Neutrophils Relative % 60 43 - 77 %   Neutro Abs 8.6 (H) 1.7 - 7.7 K/uL   Lymphocytes Relative 30 12 - 46 %   Lymphs Abs 4.3 (H) 0.7 - 4.0 K/uL   Monocytes Relative 8 3 - 12 %   Monocytes Absolute 1.1 (H) 0.1 - 1.0 K/uL   Eosinophils Relative 2 0 - 5 %   Eosinophils Absolute 0.3 0.0 - 0.7 K/uL   Basophils Relative 0 0 - 1 %   Basophils Absolute 0.0 0.0 - 0.1 K/uL  Urinalysis, Routine w reflex microscopic (not at [facility])  Result Value Ref Range   Color, Urine YELLOW YELLOW   APPearance CLEAR CLEAR   Specific Gravity, Urine 1.010 1.005 - 1.030   pH 7.0 5.0 - 8.0   Glucose, UA NEGATIVE NEGATIVE mg/dL   Hgb urine dipstick NEGATIVE NEGATIVE   Bilirubin Urine NEGATIVE NEGATIVE   Ketones, ur NEGATIVE NEGATIVE mg/dL   Protein, ur NEGATIVE  NEGATIVE mg/dL   Urobilinogen, UA 0.2 0.0 - 1.0 mg/dL   Nitrite NEGATIVE NEGATIVE   Leukocytes, UA NEGATIVE NEGATIVE  I-Stat Chem 8, ED  Result Value Ref Range   Sodium 139 135 - 145 mmol/L   Potassium 4.7 3.5 - 5.1 mmol/L   Chloride 99 (L) 101 - 111 mmol/L   BUN 9 6 - 20 mg/dL   Creatinine, Ser 6.961.10 (H) 0.44 - 1.00 mg/dL   Glucose, Bld 295129 (H) 65 - 99 mg/dL   Calcium, Ion 2.841.21 1.321.12 - 1.23 mmol/L   TCO2 26 0 - 100 mmol/L   Hemoglobin 14.6 12.0 - 15.0 g/dL   HCT 44.043.0 10.236.0 - 72.546.0 %  I-Stat CG4 Lactic Acid, ED  Result Value Ref Range   Lactic Acid, Venous 1.19 0.5 - 2.0 mmol/L    Labs Reviewed - No data to display  No results found.     ASSESSMENT & PLAN:  1. Strain of left knee, initial encounter     Follow up at occupational health Recommend a knee sleeve  Reviewed expectations re: course of current medical issues. Questions answered. Outlined signs and symptoms indicating need for more acute intervention. Patient verbalized understanding. After Visit Summary given.    Procedures:      Elvina SidleLauenstein, Brielyn Bosak, MD 04/04/17 1932

## 2017-04-04 NOTE — Discharge Instructions (Signed)
This appears to be a knee strain which will require some physical therapy.  Generally with occupational health problems, you need to be followed up in occupational health which is in the black box building [address] across from the hospital.  A knee sleeve support would help with this and they may be able to provide this tomorrow morning if you go over there.  It will also take care of the paperwork.
